# Patient Record
Sex: Male | Born: 1975
Health system: Southern US, Community
[De-identification: ages and names within clinical notes are randomized; demographics above are authoritative.]

## PROBLEM LIST (undated history)

## (undated) DIAGNOSIS — D500 Iron deficiency anemia secondary to blood loss (chronic): Secondary | ICD-10-CM

## (undated) DIAGNOSIS — K8020 Calculus of gallbladder without cholecystitis without obstruction: Secondary | ICD-10-CM

## (undated) DIAGNOSIS — E669 Obesity, unspecified: Secondary | ICD-10-CM

## (undated) DIAGNOSIS — G4730 Sleep apnea, unspecified: Secondary | ICD-10-CM

## (undated) DIAGNOSIS — Z5189 Encounter for other specified aftercare: Secondary | ICD-10-CM

## (undated) DIAGNOSIS — T7840XA Allergy, unspecified, initial encounter: Secondary | ICD-10-CM

## (undated) HISTORY — DX: Iron deficiency anemia secondary to blood loss (chronic): D50.0

## (undated) HISTORY — DX: Calculus of gallbladder without cholecystitis without obstruction: K80.20

## (undated) HISTORY — DX: Obesity, unspecified: E66.9

## (undated) HISTORY — DX: Encounter for other specified aftercare: Z51.89

## (undated) HISTORY — PX: BACK SURGERY: SHX140

## (undated) HISTORY — PX: HERNIA REPAIR: SHX51

## (undated) HISTORY — PX: CHOLECYSTECTOMY: SHX55

## (undated) HISTORY — DX: Allergy, unspecified, initial encounter: T78.40XA

## (undated) HISTORY — DX: Sleep apnea, unspecified: G47.30

## (undated) HISTORY — PX: NERVE SURGERY: SHX1016

---

## 2000-12-25 HISTORY — PX: SPLENECTOMY, TOTAL: SHX788

## 2001-12-07 ENCOUNTER — Encounter (INDEPENDENT_AMBULATORY_CARE_PROVIDER_SITE_OTHER): Payer: Self-pay | Admitting: *Deleted

## 2001-12-07 ENCOUNTER — Inpatient Hospital Stay (HOSPITAL_COMMUNITY): Admission: AC | Admit: 2001-12-07 | Discharge: 2001-12-19 | Payer: Self-pay

## 2001-12-07 ENCOUNTER — Encounter: Payer: Self-pay | Admitting: Emergency Medicine

## 2001-12-11 ENCOUNTER — Encounter: Payer: Self-pay | Admitting: Surgery

## 2001-12-13 ENCOUNTER — Encounter: Payer: Self-pay | Admitting: General Surgery

## 2001-12-15 ENCOUNTER — Encounter: Payer: Self-pay | Admitting: General Surgery

## 2001-12-17 ENCOUNTER — Encounter: Payer: Self-pay | Admitting: Surgery

## 2001-12-17 ENCOUNTER — Encounter: Payer: Self-pay | Admitting: General Surgery

## 2001-12-27 ENCOUNTER — Ambulatory Visit (HOSPITAL_COMMUNITY): Admission: RE | Admit: 2001-12-27 | Discharge: 2001-12-27 | Payer: Self-pay | Admitting: General Surgery

## 2001-12-27 ENCOUNTER — Encounter: Payer: Self-pay | Admitting: General Surgery

## 2004-03-04 ENCOUNTER — Encounter: Admission: RE | Admit: 2004-03-04 | Discharge: 2004-03-04 | Payer: Self-pay | Admitting: Orthopedic Surgery

## 2004-08-04 ENCOUNTER — Observation Stay (HOSPITAL_COMMUNITY): Admission: RE | Admit: 2004-08-04 | Discharge: 2004-08-05 | Payer: Self-pay | Admitting: Orthopedic Surgery

## 2004-08-04 ENCOUNTER — Encounter (INDEPENDENT_AMBULATORY_CARE_PROVIDER_SITE_OTHER): Payer: Self-pay | Admitting: *Deleted

## 2008-08-13 ENCOUNTER — Ambulatory Visit (HOSPITAL_COMMUNITY): Admission: RE | Admit: 2008-08-13 | Discharge: 2008-08-14 | Payer: Self-pay | Admitting: General Surgery

## 2011-02-08 ENCOUNTER — Encounter (HOSPITAL_COMMUNITY)
Admission: RE | Admit: 2011-02-08 | Discharge: 2011-02-08 | Disposition: A | Discharge status: Home / Self Care | Payer: PRIVATE HEALTH INSURANCE | Source: Ambulatory Visit | Attending: General Surgery | Admitting: General Surgery

## 2011-02-08 DIAGNOSIS — Z01812 Encounter for preprocedural laboratory examination: Secondary | ICD-10-CM | POA: Insufficient documentation

## 2011-02-08 LAB — CBC
HCT: 38 % — ABNORMAL LOW (ref 39.0–52.0)
Hemoglobin: 12.3 g/dL — ABNORMAL LOW (ref 13.0–17.0)
MCH: 28.1 pg (ref 26.0–34.0)
MCHC: 32.4 g/dL (ref 30.0–36.0)
MCV: 86.8 fL (ref 78.0–100.0)

## 2011-02-10 ENCOUNTER — Ambulatory Visit (HOSPITAL_COMMUNITY)
Admission: RE | Admit: 2011-02-10 | Discharge: 2011-02-10 | Disposition: A | Discharge status: Home / Self Care | Payer: PRIVATE HEALTH INSURANCE | Source: Ambulatory Visit | Attending: General Surgery | Admitting: General Surgery

## 2011-02-10 ENCOUNTER — Ambulatory Visit (HOSPITAL_COMMUNITY): Payer: PRIVATE HEALTH INSURANCE

## 2011-02-10 ENCOUNTER — Other Ambulatory Visit: Payer: Self-pay | Admitting: General Surgery

## 2011-02-10 DIAGNOSIS — E669 Obesity, unspecified: Secondary | ICD-10-CM | POA: Insufficient documentation

## 2011-02-10 DIAGNOSIS — K660 Peritoneal adhesions (postprocedural) (postinfection): Secondary | ICD-10-CM | POA: Insufficient documentation

## 2011-02-10 DIAGNOSIS — K8020 Calculus of gallbladder without cholecystitis without obstruction: Secondary | ICD-10-CM | POA: Insufficient documentation

## 2011-02-10 DIAGNOSIS — K219 Gastro-esophageal reflux disease without esophagitis: Secondary | ICD-10-CM | POA: Insufficient documentation

## 2011-02-10 DIAGNOSIS — Z01812 Encounter for preprocedural laboratory examination: Secondary | ICD-10-CM | POA: Insufficient documentation

## 2011-02-16 NOTE — Op Note (Signed)
NAME:  Brian Kerr, Brian Kerr                 ACCOUNT NO.:  [id number]  MEDICAL RECORD NO.:  [id number]           PATIENT TYPE:  O  LOCATION:  SDSC                         FACILITY:  [facility]  PHYSICIAN:  Adolph Pollack, M.D.DATE OF BIRTH:  [birth year]  DATE OF PROCEDURE: DATE OF DISCHARGE:  02/10/2011                              OPERATIVE REPORT   PREOPERATIVE DIAGNOSIS:  Symptomatic cholelithiasis.  POSTOPERATIVE DIAGNOSIS:  Symptomatic cholelithiasis.  PROCEDURE:  Laparoscopic lysis of adhesions (20 minutes) and cholecystectomy with intraoperative cholangiogram.  SURGEON:  Adolph Pollack, M.D.  ANESTHESIA:  General.  INDICATIONS:  Mr. Brian Kerr is a 35 year old male who has been having episodes of epigastric discomfort and nausea with right upper quadrant pain at times.  He had an ultrasound demonstrating gallstones.  The liver function tests were normal.  There was slight gallbladder wall thickening.  Because of his symptomatic cholelithiasis, he is now brought to the operating room for elective laparoscopic, possible open cholecystectomy.  He has had a previous emergency splenectomy through an upper midline incision and a laparoscopic hernia repair of a ventral incisional hernia in the past, and so he understands that it may be an open procedure.  Procedure risks and aftercare were discussed with him preoperatively.  TECHNIQUE:  He was seen in the holding area and brought to the operating room, placed supine on the operating table, and a general anesthetic was administered.  The abdominal wall hair was clipped and the abdominal wall widely sterilely prepped and draped.  A previous right midabdominal lateral incision was reincised after infiltration of Marcaine.  I dissected subcutaneous tissues.  The fascial layers were incised and the abdominal wall musculature bluntly retracted until the peritoneum was identified, and a small incision was made in the peritoneum.  The  peritoneal cavity was entered under direct vision.  Hasson trocar was introduced into the peritoneal cavity, and pneumoperitoneum was created by insufflation of CO2 gas.  Next, the laparoscope was introduced in the right upper quadrant, it was fairly free of adhesions.  There also was an area in the epigastrium for me to place a 5-mm trocar superiorly, which was done.  I then used some blunt dissection to perform lysis of adhesions between the omentum and the anterior abdominal wall, controlling bleeding with electrocautery. This allowed me to clear off an area where I could put another 5-mm trocar just below the level of the umbilicus and to the right.  Once this had been performed, I put a 5-mm trocar in the right upper quadrant.  The fundus of the gallbladder was then grasped, retracted through right shoulder.  Omental adhesions to the gallbladder were taken down with dissection on the gallbladder.  I then grasped the infundibulum, and using blunt dissection, the infundibulum was mobilized.  It was then retracted laterally.  I identified the cystic duct and the cystic artery.  Using blunt dissection, windows were created around these, and a critical view was achieved.  I then clipped the cystic artery and divided it.  A clip was placed at the cystic duct- gallbladder junction and a small incision made to the cystic duct.  A cholangiocatheter was placed through the anterior abdominal wall, placed into the cystic duct, and the cholangiogram was performed.  Using dilute contrast, I injected into the cystic duct under fluoroscopy.  The common hepatic, right and left hepatic, and common bile ducts all filled promptly.  The common bile duct also drained promptly without obvious evidence of obstruction.  Final report is pending the radiologist's interpretation.  Following this, the cholangiocatheter was removed, and the cystic duct was clipped three times on the biliary side and divided.   I then dissected the gallbladder free from the liver using electrocautery. Once this was done, it was placed in an Endopouch bag.  The gallbladder fossa was copiously irrigated with saline.  Bleeding points were controlled with electrocautery.  Further inspection demonstrated no evidence of bleeding and no evidence of bile leak.  I then removed the gallbladder and the Endopouch bag through the Hasson trocar port.  I then closed the fascial defect using Endoclose device and a zero Vicryl suture in this area.  Remaining irrigation fluid was evacuated.  The area of lysis of adhesions and the liver were inspected; there was no evidence of bleeding, bowel injury, or bile leak.  The CO2 gas was released and the remaining trocars removed.  All skin incisions were closed with 4-0 Monocryl subcuticular stitches. Steri-Strips and sterile dressings were applied.  He tolerated the procedure without any apparent complications and was taken to the recovery room in satisfactory condition.     Adolph Pollack, M.D.     Kari Baars  D:  02/10/2011  T:  02/10/2011  Job:  [id number]  cc:   Ernestina Penna, M.D.  Electronically Signed by Avel Peace M.D. on 02/16/2011 09:33:37 AM

## 2011-05-09 NOTE — Op Note (Signed)
NAME:  Brian Kerr, Brian Kerr                 ACCOUNT NO.:  [id number]   MEDICAL RECORD NO.:  [id number]          PATIENT TYPE:  OIB   LOCATION:  5155                         FACILITY:  [facility]   PHYSICIAN:  Adolph Pollack, M.D.DATE OF BIRTH:  1976/03/23   DATE OF PROCEDURE:  DATE OF DISCHARGE:                               OPERATIVE REPORT   PREOPERATIVE DIAGNOSIS:  Ventral incisional hernia.   POSTOPERATIVE DIAGNOSIS:  Ventral incisional hernia.   PROCEDURE:  Laparoscopic repair of ventral incisional hernia with mesh.   SURGEON:  Adolph Pollack, MD   ASSISTANT:  Almond Lint, MD   ANESTHESIA:  General.   INDICATIONS:  This 35 year old male underwent exploratory laparotomy and  splenectomy for ruptured spleen many years ago.  He has an enlarging and  symptomatic incisional hernia and now presents for repair.  The  procedure and risks were discussed with him preoperatively.   TECHNIQUE:  He was brought to the operating room, placed supine on the  operating table and general anesthetic was administered.  The hair on  the abdominal wall was clipped and a Foley catheter was inserted.  The  abdominal wall was sterilely prepped and draped.  On the right mid and  lateral abdomen, an incision was made through the skin, subcutaneous  tissue into fascial layers, and the peritoneal cavity was entered.  A  Hassan trocar was introduced into the peritoneal cavity.  A  pneumoperitoneum created by insufflation of CO2 gas.  The laparoscope  was introduced and I noted omental adhesions to the upper midline  incision.  A 5-mm trocar was then placed in the right lower quadrant and  a 11-mm trocar placed in the left midabdomen.  Using careful sharp and  blunt dissection, I dissected the omentum free from the abdominal wall  exposing a hernia defect in the mid epigastric region and a smaller one  just superior to this.  I did not notice any adhesion from the intestine  to abdominal wall.  I dissected  the omentum free from the abdominal  wall, well around the hernia defects.  Using a spinal needle, I then  marked the periphery of the hernia defects and measured 4 cm away from  both of these.  I brought a 15 x 20 cm piece of Parietex mesh with a  nonadherent barrier into the field which appeared to be appropriate size  for the defects.  This allowed for adequate overlap too.  Four anchoring  sutures were placed in the 4 quadrants of the mesh of #1 Novafil.  Mesh  was hydrated and then placed into abdominal cavity.  The mesh was  unrolled with the non-adherent barrier facing the viscera.  Four stab  incisions were made in four quadrants around the hernia defects and the  anchoring sutures brought up across the fascial bridge and tied down  initially anchoring the mesh to the abdominal wall.  Using the spiral  tacker, I then further anchored the mesh with an outer rim of tacks and  an inner rim of tacks to allow for good fixation of the mesh.  Following this, I inspected the area and noted some bleeding from the  omentum, which I controlled with harmonic scalpel.  Further inspection  of the abdominal cavity demomstrated no active bleeding or organ injury.  Once hemostasis was adequate, I then removed the [facility] trocar and used  a 0 Vicryl suture and Endoclose device to close  the fascia.  I then removed the remaining trocars and released  pneumoperitoneum.  The skin incisions were then closed with 4-0 Monocryl  subcuticular stitches followed by Steri-Strips and sterile dressings.  He tolerated the procedure well without any apparent complications and  was taken to the recovery room in satisfactory condition.      Adolph Pollack, M.D.  Electronically Signed     TJR/MEDQ  D:  08/13/2008  T:  08/14/2008  Job:  [id number]   cc:   [facility]
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
## 2011-05-12 NOTE — Op Note (Signed)
NAME:  Brian Kerr, Brian Kerr                           ACCOUNT NO.:  [id number]   MEDICAL RECORD NO.:  [id number]                   PATIENT TYPE:  AMB   LOCATION:  DAY                                  FACILITY:  [facility]   PHYSICIAN:  Marlowe Kays, M.D.               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   OPERATION:  Microdiskectomy, L4-5, left.   SURGEON:  Dr. Fayrene Fearing [place]   ASSISTANT:  Dr. Worthy Rancher   ANESTHESIA:  General.   PATHOLOGY AND JUSTIFICATION FOR PROCEDURE:  He has had a many months'  history of persistent left leg pain, no neurologic deficit.  MRI and  myelogram have demonstrated a large disk herniation at L4-5 to the left with  some disk material over the body of L5.  Because of the persistence of his  problems, he has also had a second opinion recommending the microdiskectomy  on the left and is here today for the above-mentioned surgery.  Findings at  surgery confirmed the MRI and myelogram.   DESCRIPTION OF PROCEDURE:  Satisfactory general anesthesia, prophylactic  antibiotics, knee-chest position on the [place] frame.  Back was prepped  with DuraPrep.  And with three spinal needles and a lateral x-ray, we  tentatively localized the L4-5 disk and then continued draping the back in a  sterile field.  I made a midline incision based on the initial x-ray.  Soft  tissue was dissected off the spinous processes of what I thought were L4 and  L5 which were tagged with Kocher clamps and second lateral x-ray taken  demonstrating that we were indeed at L4 and at L5.  With the L4-5 interspace  just somewhat caudal to the superior clamp.  I then continued dissecting the  soft tissue off the lamina of L4 and L5, and  we placed a self-retaining  McCullough retractor.  With double-action rongeur, I removed some of the  inferior lamina of L4 and then with a small curette, undermined the superior  portion of the lamina of L5 and entered beneath it with a 2 mm Kerrison  rongeur and then with a combination of 2 and 3 mm Kerrison rongeurs, removed  bone and ligamentum flavum until the preliminary opening for the diskectomy  was made.  We then brought in the microscope and completed the foraminotomy  and lateral recess decompression.  He was extremely tight.  The L5 nerve  root was identified and gently mobilized medially.  The large disk  herniation beneath it was noted.  Some overlying vein s were coagulated with  bipolar cautery.  The disk was opened, and a good chunk of the annulus  removed to allow removal of the disk which was somewhat tenacious.  It did  extend down over the body of L5.  With a combination of straight and angled  upbiting pituitaries, nerve hook, and Epstein curette, we removed all disk  material obtainable from the interspace.  Then checked with a hockey-stick.  The L3-4 foramen was patent.  The L5 nerve root was thoroughly decompressed  and mobile.  There was no disk material beneath it and no disk material in  the axilla.  The wound was also dry.  I irrigated with antibiotic solution  and placed Gelfoam over the interspace about the nerve and the dura.  Self-  retaining retractors were removed.  Once again, no unusual bleeding was  noted.  He was given 30 mg of Toradol IV.  Fascia was closed with  interrupted #1 Vicryl as was the deep subcutaneous tissues with 2-0 Vicryl  in the superficial subcutaneous tissue and staples in the skin.  Subcutaneous tissue was also infiltrated with 0.5% Marcaine with adrenalin.  Betadine and Adaptic dry sterile dressing were applied.  He was transferred  to his bed gently and taken to the recovery room in satisfactory condition  with no known  complications.                                               Marlowe Kays, M.D.    JA/MEDQ  D:  08/04/2004  T:  08/04/2004  Job:  [id number]
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
## 2014-08-27 ENCOUNTER — Ambulatory Visit: Payer: PRIVATE HEALTH INSURANCE | Admitting: Internal Medicine

## 2014-09-07 ENCOUNTER — Ambulatory Visit: Payer: PRIVATE HEALTH INSURANCE | Admitting: Internal Medicine

## 2014-09-28 ENCOUNTER — Ambulatory Visit: Payer: PRIVATE HEALTH INSURANCE | Admitting: Internal Medicine

## 2014-10-22 ENCOUNTER — Ambulatory Visit: Payer: PRIVATE HEALTH INSURANCE | Admitting: Internal Medicine

## 2016-02-11 ENCOUNTER — Telehealth: Payer: Self-pay | Admitting: Family Medicine

## 2016-02-11 NOTE — Telephone Encounter (Signed)
Patient given appointment for 8:30 in the am

## 2016-02-12 ENCOUNTER — Ambulatory Visit: Payer: Self-pay

## 2016-06-14 ENCOUNTER — Ambulatory Visit (INDEPENDENT_AMBULATORY_CARE_PROVIDER_SITE_OTHER): Payer: BLUE CROSS/BLUE SHIELD | Admitting: Physician Assistant

## 2016-06-14 ENCOUNTER — Encounter: Payer: Self-pay | Admitting: Physician Assistant

## 2016-06-14 VITALS — BP 157/100 | HR 82 | Temp 97.4°F | Ht 75.0 in | Wt 312.8 lb

## 2016-06-14 DIAGNOSIS — J0100 Acute maxillary sinusitis, unspecified: Secondary | ICD-10-CM | POA: Diagnosis not present

## 2016-06-14 MED ORDER — SULFAMETHOXAZOLE-TRIMETHOPRIM 800-160 MG PO TABS: 1.0000 | ORAL_TABLET | Freq: Two times a day (BID) | ORAL | Status: DC

## 2016-06-14 NOTE — Progress Notes (Signed)
Subjective:     Patient ID: Brian Kerr, male   DOB: Aug 26, 1976, 40 y.o.   MRN: [id number]  HPI Pt with sinus pressure and pain x 2 weeks Now with drainage from the L eye He wears contacts but took them out last pm and wearing glasses  Review of Systems  Constitutional: Negative.   HENT: Positive for congestion, postnasal drip and sinus pressure. Negative for ear discharge, ear pain, facial swelling, nosebleeds, rhinorrhea, sneezing, sore throat, tinnitus, trouble swallowing and voice change.   Eyes: Positive for discharge, redness and itching. Negative for photophobia, pain and visual disturbance.  Respiratory: Negative.   Cardiovascular: Negative.        Objective:   Physical Exam  Constitutional: He appears well-developed and well-nourished.  HENT:  Right Ear: External ear normal.  Left Ear: External ear normal.  Mouth/Throat: Oropharynx is clear and moist. No oropharyngeal exudate.  TM's nl bilat + TTP of frontal and maxillary sinus  Eyes: EOM are normal. Pupils are equal, round, and reactive to light. Right eye exhibits no discharge. Left eye exhibits discharge.  Sl lid edema to the L eye with matting to the lashes  Neck: Neck supple.  Cardiovascular: Normal rate, regular rhythm and normal heart sounds.   Pulmonary/Chest: Effort normal and breath sounds normal. No respiratory distress. He has no wheezes. He has no rales.  Lymphadenopathy:    He has no cervical adenopathy.  Nursing note and vitals reviewed.      Assessment:     1. Acute maxillary sinusitis, recurrence not specified        Plan:     Bactrim DS to cover both sinus and eye No contacts x 1 week Cool compresses Clean cases Pt to f/u regarding BP

## 2016-06-14 NOTE — Patient Instructions (Signed)
Bacterial Conjunctivitis Bacterial conjunctivitis, commonly called pink eye, is an inflammation of the clear membrane that covers the white part of the eye (conjunctiva). The inflammation can also happen on the underside of the eyelids. The blood vessels in the conjunctiva become inflamed, causing the eye to become red or pink. Bacterial conjunctivitis may spread easily from one eye to another and from person to person (contagious).  CAUSES  Bacterial conjunctivitis is caused by bacteria. The bacteria may come from your own skin, your upper respiratory tract, or from someone else with bacterial conjunctivitis. SYMPTOMS  The normally white color of the eye or the underside of the eyelid is usually pink or red. The pink eye is usually associated with irritation, tearing, and some sensitivity to light. Bacterial conjunctivitis is often associated with a thick, yellowish discharge from the eye. The discharge may turn into a crust on the eyelids overnight, which causes your eyelids to stick together. If a discharge is present, there may also be some blurred vision in the affected eye. DIAGNOSIS  Bacterial conjunctivitis is diagnosed by your caregiver through an eye exam and the symptoms that you report. Your caregiver looks for changes in the surface tissues of your eyes, which may point to the specific type of conjunctivitis. A sample of any discharge may be collected on a cotton-tip swab if you have a severe case of conjunctivitis, if your cornea is affected, or if you keep getting repeat infections that do not respond to treatment. The sample will be sent to a lab to see if the inflammation is caused by a bacterial infection and to see if the infection will respond to antibiotic medicines. TREATMENT   Bacterial conjunctivitis is treated with antibiotics. Antibiotic eyedrops are most often used. However, antibiotic ointments are also available. Antibiotics pills are sometimes used. Artificial tears or eye  washes may ease discomfort. HOME CARE INSTRUCTIONS   To ease discomfort, apply a cool, clean washcloth to your eye for 10-20 minutes, 3-4 times a day.  Gently wipe away any drainage from your eye with a warm, wet washcloth or a cotton ball.  Wash your hands often with soap and water. Use paper towels to dry your hands.  Do not share towels or washcloths. This may spread the infection.  Change or wash your pillowcase every day.  You should not use eye makeup until the infection is gone.  Do not operate machinery or drive if your vision is blurred.  Stop using contact lenses. Ask your caregiver how to sterilize or replace your contacts before using them again. This depends on the type of contact lenses that you use.  When applying medicine to the infected eye, do not touch the edge of your eyelid with the eyedrop bottle or ointment tube. SEEK IMMEDIATE MEDICAL CARE IF:   Your infection has not improved within 3 days after beginning treatment.  You had yellow discharge from your eye and it returns.  You have increased eye pain.  Your eye redness is spreading.  Your vision becomes blurred.  You have a fever or persistent symptoms for more than 2-3 days.  You have a fever and your symptoms suddenly get worse.  You have facial pain, redness, or swelling. MAKE SURE YOU:   Understand these instructions.  Will watch your condition.  Will get help right away if you are not doing well or get worse.   This information is not intended to replace advice given to you by your health care provider. Make sure you   discuss any questions you have with your health care provider.   Document Released: 12/11/2005 Document Revised: 01/01/2015 Document Reviewed: 05/13/2012 Elsevier Interactive Patient Education 2016 [employer] Sinusitis, Adult Sinusitis is redness, soreness, and inflammation of the paranasal sinuses. Paranasal sinuses are air pockets within the bones of your face. They are  located beneath your eyes, in the middle of your forehead, and above your eyes. In healthy paranasal sinuses, mucus is able to drain out, and air is able to circulate through them by way of your nose. However, when your paranasal sinuses are inflamed, mucus and air can become trapped. This can allow bacteria and other germs to grow and cause infection. Sinusitis can develop quickly and last only a short time (acute) or continue over a long period (chronic). Sinusitis that lasts for more than 12 weeks is considered chronic. CAUSES Causes of sinusitis include:  Allergies.  Structural abnormalities, such as displacement of the cartilage that separates your nostrils (deviated septum), which can decrease the air flow through your nose and sinuses and affect sinus drainage.  Functional abnormalities, such as when the small hairs (cilia) that line your sinuses and help remove mucus do not work properly or are not present. SIGNS AND SYMPTOMS Symptoms of acute and chronic sinusitis are the same. The primary symptoms are pain and pressure around the affected sinuses. Other symptoms include:  Upper toothache.  Earache.  Headache.  Bad breath.  Decreased sense of smell and taste.  A cough, which worsens when you are lying flat.  Fatigue.  Fever.  Thick drainage from your nose, which often is green and may contain pus (purulent).  Swelling and warmth over the affected sinuses. DIAGNOSIS Your health care provider will perform a physical exam. During your exam, your health care provider may perform any of the following to help determine if you have acute sinusitis or chronic sinusitis:  Look in your nose for signs of abnormal growths in your nostrils (nasal polyps).  Tap over the affected sinus to check for signs of infection.  View the inside of your sinuses using an imaging device that has a light attached (endoscope). If your health care provider suspects that you have chronic sinusitis,  one or more of the following tests may be recommended:  Allergy tests.  Nasal culture. A sample of mucus is taken from your nose, sent to a lab, and screened for bacteria.  Nasal cytology. A sample of mucus is taken from your nose and examined by your health care provider to determine if your sinusitis is related to an allergy. TREATMENT Most cases of acute sinusitis are related to a viral infection and will resolve on their own within 10 days. Sometimes, medicines are prescribed to help relieve symptoms of both acute and chronic sinusitis. These may include pain medicines, decongestants, nasal steroid sprays, or saline sprays. However, for sinusitis related to a bacterial infection, your health care provider will prescribe antibiotic medicines. These are medicines that will help kill the bacteria causing the infection. Rarely, sinusitis is caused by a fungal infection. In these cases, your health care provider will prescribe antifungal medicine. For some cases of chronic sinusitis, surgery is needed. Generally, these are cases in which sinusitis recurs more than 3 times per year, despite other treatments. HOME CARE INSTRUCTIONS  Drink plenty of water. Water helps thin the mucus so your sinuses can drain more easily.  Use a humidifier.  Inhale steam 3-4 times a day (for example, sit in the bathroom with the shower  running).  Apply a warm, moist washcloth to your face 3-4 times a day, or as directed by your health care provider.  Use saline nasal sprays to help moisten and clean your sinuses.  Take medicines only as directed by your health care provider.  If you were prescribed either an antibiotic or antifungal medicine, finish it all even if you start to feel better. SEEK IMMEDIATE MEDICAL CARE IF:  You have increasing pain or severe headaches.  You have nausea, vomiting, or drowsiness.  You have swelling around your face.  You have vision problems.  You have a stiff  neck.  You have difficulty breathing.   This information is not intended to replace advice given to you by your health care provider. Make sure you discuss any questions you have with your health care provider.   Document Released: 12/11/2005 Document Revised: 01/01/2015 Document Reviewed: 12/26/2011 Elsevier Interactive Patient Education [employer].

## 2016-06-16 ENCOUNTER — Encounter: Payer: Self-pay | Admitting: Family

## 2016-06-16 ENCOUNTER — Ambulatory Visit (INDEPENDENT_AMBULATORY_CARE_PROVIDER_SITE_OTHER): Payer: BLUE CROSS/BLUE SHIELD | Admitting: Family

## 2016-06-16 ENCOUNTER — Telehealth: Payer: Self-pay | Admitting: Physician Assistant

## 2016-06-16 ENCOUNTER — Ambulatory Visit: Payer: BLUE CROSS/BLUE SHIELD | Admitting: Family Medicine

## 2016-06-16 VITALS — BP 138/81 | HR 83 | Temp 97.1°F | Ht 75.0 in | Wt 311.0 lb

## 2016-06-16 DIAGNOSIS — J020 Streptococcal pharyngitis: Secondary | ICD-10-CM | POA: Diagnosis not present

## 2016-06-16 DIAGNOSIS — J069 Acute upper respiratory infection, unspecified: Secondary | ICD-10-CM | POA: Diagnosis not present

## 2016-06-16 MED ORDER — AMOXICILLIN-POT CLAVULANATE 875-125 MG PO TABS
1.0000 | ORAL_TABLET | Freq: Two times a day (BID) | ORAL | Status: DC
Start: 2016-06-16 — End: 2016-06-29

## 2016-06-16 NOTE — Progress Notes (Signed)
Subjective:    Patient ID: Brian Kerr, male    DOB: Dec 03, 1976, 40 y.o.   MRN: [id number]  Sinus Problem This is a recurrent problem. The current episode started in the past 7 days. The problem is unchanged. There has been no fever. His pain is at a severity of 7/10. The pain is mild. Associated symptoms include congestion, coughing, ear pain, headaches, a hoarse voice, neck pain, sinus pressure, a sore throat and swollen glands. Pertinent negatives include no chills or sneezing. Past treatments include oral decongestants. The treatment provided mild relief.  Sore Throat  Associated symptoms include congestion, coughing, ear pain, headaches, a hoarse voice, neck pain and swollen glands.      Review of Systems  Constitutional: Negative.  Negative for chills.  HENT: Positive for congestion, ear pain, hoarse voice, sinus pressure and sore throat. Negative for sneezing.   Respiratory: Positive for cough.   Cardiovascular: Negative.   Gastrointestinal: Negative.   Endocrine: Negative.   Genitourinary: Negative.   Musculoskeletal: Positive for neck pain.  Neurological: Positive for headaches.  Hematological: Negative.   Psychiatric/Behavioral: Negative.   All other systems reviewed and are negative.      Objective:   Physical Exam  Constitutional: He is oriented to person, place, and time. He appears well-developed and well-nourished. No distress.  HENT:  Head: Normocephalic.  Right Ear: External ear normal.  Left Ear: External ear normal.  Mouth/Throat: Oropharyngeal exudate present.  Nasal passage erythemas with mild swelling  Oropharynx erythemas, tonsils 2+  Eyes: Pupils are equal, round, and reactive to light. Right eye exhibits no discharge. Left eye exhibits no discharge.  Neck: Normal range of motion. Neck supple. No thyromegaly present.  Cardiovascular: Normal rate, regular rhythm, normal heart sounds and intact distal pulses.   No murmur heard. Pulmonary/Chest:  Effort normal and breath sounds normal. No respiratory distress. He has no wheezes.  Abdominal: Soft. Bowel sounds are normal. He exhibits no distension. There is no tenderness.  Musculoskeletal: Normal range of motion. He exhibits no edema or tenderness.  Neurological: He is alert and oriented to person, place, and time.  Skin: Skin is warm and dry. No rash noted. No erythema.  Psychiatric: He has a normal mood and affect. His behavior is normal. Judgment and thought content normal.  Vitals reviewed.     BP 138/81 mmHg  Pulse 83  Temp(Src) 97.1 F (36.2 C) (Oral)  Ht 6\' 3"  (1.905 m)  Wt 311 lb (141.069 kg)  BMI 38.87 kg/m2     Assessment & Plan:  1. Acute upper respiratory infection -- Take meds as prescribed - Use a cool mist humidifier  -Use saline nose sprays frequently -Saline irrigations of the nose can be very helpful if done frequently.  * 4X daily for 1 week*  * Use of a nettie pot can be helpful with this. Follow directions with this* -Force fluids -For any cough or congestion  Use plain Mucinex- regular strength or max strength is fine   * Children- consult with Pharmacist for dosing -For fever or aces or pains- take tylenol or ibuprofen appropriate for age and weight.  * for fevers greater than 101 orally you may alternate ibuprofen and tylenol every  3 hours. -Throat lozenges if help -New toothbrush in 3 days - amoxicillin-clavulanate (AUGMENTIN) 875-125 MG tablet; Take 1 tablet by mouth 2 (two) times daily.  Dispense: 20 tablet; Refill: 0  2. Streptococcal sore throat - amoxicillin-clavulanate (AUGMENTIN) 875-125 MG tablet; Take 1 tablet by  mouth 2 (two) times daily.  Dispense: 20 tablet; Refill: 0  Evelina Dun, FNP

## 2016-06-16 NOTE — Patient Instructions (Addendum)
Strep Throat Strep throat is a bacterial infection of the throat. Your health care provider may call the infection tonsillitis or pharyngitis, depending on whether there is swelling in the tonsils or at the back of the throat. Strep throat is most common during the cold months of the year in children who are 5-41 years of age, but it can happen during any season in people of any age. This infection is spread from person to person (contagious) through coughing, sneezing, or close contact. CAUSES Strep throat is caused by the bacteria called Streptococcus pyogenes. RISK FACTORS This condition is more likely to develop in:  People who spend time in crowded places where the infection can spread easily.  People who have close contact with someone who has strep throat. SYMPTOMS Symptoms of this condition include:  Fever or chills.   Redness, swelling, or pain in the tonsils or throat.  Pain or difficulty when swallowing.  White or yellow spots on the tonsils or throat.  Swollen, tender glands in the neck or under the jaw.  Red rash all over the body (rare). DIAGNOSIS This condition is diagnosed by performing a rapid strep test or by taking a swab of your throat (throat culture test). Results from a rapid strep test are usually ready in a few minutes, but throat culture test results are available after one or two days. TREATMENT This condition is treated with antibiotic medicine. HOME CARE INSTRUCTIONS Medicines  Take over-the-counter and prescription medicines only as told by your health care provider.  Take your antibiotic as told by your health care provider. Do not stop taking the antibiotic even if you start to feel better.  Have family members who also have a sore throat or fever tested for strep throat. They may need antibiotics if they have the strep infection. Eating and Drinking  Do not share food, drinking cups, or personal items that could cause the infection to spread to  other people.  If swallowing is difficult, try eating soft foods until your sore throat feels better.  Drink enough fluid to keep your urine clear or pale yellow. General Instructions  Gargle with a salt-water mixture 3-4 times per day or as needed. To make a salt-water mixture, completely dissolve -1 tsp of salt in 1 cup of warm water.  Make sure that all household members wash their hands well.  Get plenty of rest.  Stay home from school or work until you have been taking antibiotics for 24 hours.  Keep all follow-up visits as told by your health care provider. This is important. SEEK MEDICAL CARE IF:  The glands in your neck continue to get bigger.  You develop a rash, cough, or earache.  You cough up a thick liquid that is green, yellow-brown, or bloody.  You have pain or discomfort that does not get better with medicine.  Your problems seem to be getting worse rather than better.  You have a fever. SEEK IMMEDIATE MEDICAL CARE IF:  You have new symptoms, such as vomiting, severe headache, stiff or painful neck, chest pain, or shortness of breath.  You have severe throat pain, drooling, or changes in your voice.  You have swelling of the neck, or the skin on the neck becomes red and tender.  You have signs of dehydration, such as fatigue, dry mouth, and decreased urination.  You become increasingly sleepy, or you cannot wake up completely.  Your joints become red or painful.   This information is not intended to replace   advice given to you by your health care provider. Make sure you discuss any questions you have with your health care provider.   Document Released: 12/08/2000 Document Revised: 09/01/2015 Document Reviewed: 04/05/2015 Elsevier Interactive Patient Education 2016 [place] meds as prescribed - Use a cool mist humidifier  -Use saline nose sprays frequently -Saline irrigations of the nose can be very helpful if done frequently.  * 4X  daily for 1 week*  * Use of a nettie pot can be helpful with this. Follow directions with this* -Force fluids -For any cough or congestion  Use plain Mucinex- regular strength or max strength is fine   * Children- consult with Pharmacist for dosing -For fever or aces or pains- take tylenol or ibuprofen appropriate for age and weight.  * for fevers greater than 101 orally you may alternate ibuprofen and tylenol every  3 hours. -Throat lozenges if help -New toothbrush in 3 days   Evelina Dun, FNP

## 2016-06-16 NOTE — Telephone Encounter (Signed)
Advised pt we would need to pull his paper chart and then would let him know. Pt voiced understanding.

## 2016-06-29 ENCOUNTER — Encounter: Payer: Self-pay | Admitting: Family Medicine

## 2016-06-29 ENCOUNTER — Ambulatory Visit (INDEPENDENT_AMBULATORY_CARE_PROVIDER_SITE_OTHER): Payer: BLUE CROSS/BLUE SHIELD | Admitting: Family Medicine

## 2016-06-29 VITALS — BP 145/95 | HR 76 | Temp 97.2°F | Ht 75.0 in | Wt 308.4 lb

## 2016-06-29 DIAGNOSIS — R030 Elevated blood-pressure reading, without diagnosis of hypertension: Secondary | ICD-10-CM

## 2016-06-29 DIAGNOSIS — Z0000 Encounter for general adult medical examination without abnormal findings: Secondary | ICD-10-CM | POA: Diagnosis not present

## 2016-06-29 DIAGNOSIS — Z800 Family history of malignant neoplasm of digestive organs: Secondary | ICD-10-CM

## 2016-06-29 DIAGNOSIS — R0683 Snoring: Secondary | ICD-10-CM

## 2016-06-29 NOTE — Patient Instructions (Signed)
Great to see you!  Check your blood pressure 2-3 times a week over th enext 2 months, follow up if you have readings over 140/90  We will call with lab results within 1 week  You will hear from GI about a colonoscopy

## 2016-06-29 NOTE — Progress Notes (Signed)
   HPI  Patient presents today here for an annual physical exam.  Patient has no complaints. He requests colonoscopy, his mother died from colon cancer at the age of 103, it was diagnosed at age of 29. He denies any rectal bleeding or other concerns.  He has a history of traumatic splenic rupture with spleen removal. He then had a hernia which required mesh implantation. He's also had gallbladder removal. He has mild steatorrhea after  greasy meals.  Snoring Patient also states that his wife is concerned he has sleep apnea He has loud snoring at night, periods of apnea He has mild daytime sleepiness.  He is watching his diet minimally, he is also planning to become physically active but overall has not started any formal regimen.  PMH: Smoking status noted ROS: Per HPI  Objective: BP 145/95 mmHg  Pulse 76  Temp(Src) 97.2 F (36.2 C) (Oral)  Ht '6\' 3"'$  (1.905 m)  Wt 308 lb 6.4 oz (139.889 kg)  BMI 38.55 kg/m2 Gen: NAD, alert, cooperative with exam HEENT: NCAT, EOMI, nares clear, oropharynx clear, TMs normal bilaterally Neck circumference 17 inches CV: RRR, good S1/S2, no murmur Resp: CTABL, no wheezes, non-labored Abd: SNTND, BS present, no guarding or organomegaly, well-healed scar midline and right upper quadrant Ext: No edema, warm Neuro: Alert and oriented, No gross deficits  Stop-bang  score is 5  Assessment and plan:  # Elevated blood pressure without diagnosis of hypertension Borderline Discussed diet and her size as therapeutic lifestyle changes Given log and follow-up in 2-3 months  Vitals - 1 value per visit 06/29/2016 06/16/2016 3/41/9622  SYSTOLIC [phone]  DIASTOLIC [phone]    # Snoring Referring to pulmonology for OSA eval Stop Bang score 5  # Annual physical exam Labs Discussed diet and exercise to reduce weight  # Family history of colon cancer His mother died from colon cancer 2 months after diagnosis when she was 30 I referred him to GI  for discussion of colonoscopy    Orders Placed This Encounter  Procedures  . CMP14+EGFR  . CBC with Differential  . TSH  . Lipid Panel  . Ambulatory referral to Gastroenterology    Referral Priority:  Routine    Referral Type:  Consultation    Referral Reason:  Specialty Services Required    Number of Visits Requested:  [place], MD [facility] 06/29/2016, 10:10 AM

## 2016-06-30 LAB — CMP14+EGFR
ALK PHOS: 77 IU/L (ref 39–117)
ALT: 67 IU/L — AB (ref 0–44)
AST: 42 IU/L — AB (ref 0–40)
Albumin/Globulin Ratio: 1.4 (ref 1.2–2.2)
Albumin: 4.3 g/dL (ref 3.5–5.5)
BUN/Creatinine Ratio: 9 (ref 9–20)
BUN: 8 mg/dL (ref 6–24)
Bilirubin Total: 0.9 mg/dL (ref 0.0–1.2)
CALCIUM: 9.4 mg/dL (ref 8.7–10.2)
CO2: 23 mmol/L (ref 18–29)
CREATININE: 0.93 mg/dL (ref 0.76–1.27)
Chloride: 98 mmol/L (ref 96–106)
GFR calc Af Amer: 117 mL/min/{1.73_m2} (ref >59)
GFR, EST NON AFRICAN AMERICAN: 102 mL/min/{1.73_m2} (ref >59)
GLUCOSE: 94 mg/dL (ref 65–99)
Globulin, Total: 3 g/dL (ref 1.5–4.5)
Potassium: 4.1 mmol/L (ref 3.5–5.2)
Sodium: 140 mmol/L (ref 134–144)
Total Protein: 7.3 g/dL (ref 6.0–8.5)

## 2016-06-30 LAB — LIPID PANEL
CHOLESTEROL TOTAL: 246 mg/dL — AB (ref 100–199)
Chol/HDL Ratio: 7.7 ratio units — ABNORMAL HIGH (ref 0.0–5.0)
HDL: 32 mg/dL — AB (ref >39)
LDL CALC: 164 mg/dL — AB (ref 0–99)
TRIGLYCERIDES: 248 mg/dL — AB (ref 0–149)
VLDL Cholesterol Cal: 50 mg/dL — ABNORMAL HIGH (ref 5–40)

## 2016-06-30 LAB — CBC WITH DIFFERENTIAL/PLATELET
Basophils Absolute: 0.1 10*3/uL (ref 0.0–0.2)
Basos: 0 %
EOS (ABSOLUTE): 0.6 10*3/uL — ABNORMAL HIGH (ref 0.0–0.4)
EOS: 4 %
HEMATOCRIT: 50.1 % (ref 37.5–51.0)
HEMOGLOBIN: 17.3 g/dL (ref 12.6–17.7)
IMMATURE GRANS (ABS): 0 10*3/uL (ref 0.0–0.1)
IMMATURE GRANULOCYTES: 0 %
LYMPHS: 23 %
Lymphocytes Absolute: 3.1 10*3/uL (ref 0.7–3.1)
MCH: 31.6 pg (ref 26.6–33.0)
MCHC: 34.5 g/dL (ref 31.5–35.7)
MCV: 91 fL (ref 79–97)
MONOCYTES: 7 %
MONOS ABS: 0.9 10*3/uL (ref 0.1–0.9)
NEUTROS PCT: 66 %
Neutrophils Absolute: 8.6 10*3/uL — ABNORMAL HIGH (ref 1.4–7.0)
Platelets: 423 10*3/uL — ABNORMAL HIGH (ref 150–379)
RBC: 5.48 x10E6/uL (ref 4.14–5.80)
RDW: 14.1 % (ref 12.3–15.4)
WBC: 13.3 10*3/uL — AB (ref 3.4–10.8)

## 2016-06-30 LAB — TSH: TSH: 2.5 u[IU]/mL (ref 0.450–4.500)

## 2016-07-18 ENCOUNTER — Telehealth: Payer: Self-pay | Admitting: Physician Assistant

## 2016-07-19 NOTE — Telephone Encounter (Signed)
Patient aware of results.

## 2016-07-31 ENCOUNTER — Encounter: Payer: Self-pay | Admitting: Internal Medicine

## 2016-08-03 ENCOUNTER — Ambulatory Visit: Payer: BLUE CROSS/BLUE SHIELD | Admitting: Family Medicine

## 2016-09-27 ENCOUNTER — Encounter (INDEPENDENT_AMBULATORY_CARE_PROVIDER_SITE_OTHER): Payer: Self-pay

## 2016-09-27 ENCOUNTER — Encounter: Payer: Self-pay | Admitting: Pulmonary Disease

## 2016-09-27 ENCOUNTER — Ambulatory Visit (INDEPENDENT_AMBULATORY_CARE_PROVIDER_SITE_OTHER): Payer: BLUE CROSS/BLUE SHIELD | Admitting: Pulmonary Disease

## 2016-09-27 VITALS — BP 126/88 | HR 79 | Ht 72.0 in | Wt 312.6 lb

## 2016-09-27 DIAGNOSIS — E6609 Other obesity due to excess calories: Secondary | ICD-10-CM | POA: Diagnosis not present

## 2016-09-27 DIAGNOSIS — Z6839 Body mass index (BMI) 39.0-39.9, adult: Secondary | ICD-10-CM

## 2016-09-27 DIAGNOSIS — G4710 Hypersomnia, unspecified: Secondary | ICD-10-CM | POA: Insufficient documentation

## 2016-09-27 DIAGNOSIS — E669 Obesity, unspecified: Secondary | ICD-10-CM | POA: Insufficient documentation

## 2016-09-27 NOTE — Patient Instructions (Signed)

## 2016-09-27 NOTE — Assessment & Plan Note (Signed)
Weight reduction 

## 2016-09-27 NOTE — Assessment & Plan Note (Signed)
Patient has snoring, witnessed apneas, occasional gasping or choking, occasional frequent awakenings. Sleeps for 6-7 hrs/night. Has unrefreshed sleep in am with headaches. Has hypersomnia. Hypersomnia affects fxnality. Takes naps in pm if he has the time. (-) abnormal behavior in sleep.   ESS 12.   Plan :  We discussed about the diagnosis of Obstructive Sleep Apnea (OSA) and implications of untreated OSA. We discussed about CPAP and BiPaP as possible treatment options.    We will schedule the patient for a sleep study. Plan for a HST. Anticipate no issues with cpap. He knows people with OSA on CPAP. May need BiPaP.    Patient was instructed to call the office if he/she has not heard back from the office 1-2 weeks after the sleep study.   Patient was instructed to call the office if he/she is having issues with the PAP device.   We discussed good sleep hygiene.   Patient was advised not to engage in activities requiring concentration and/or vigilance if he/she is sleepy.  Patient was advised not to drive if he/she is sleepy.

## 2016-09-27 NOTE — Progress Notes (Signed)
Subjective:    Patient ID: Brian Kerr, male    DOB: 04-May-1976, 40 y.o.   MRN: [id number]  HPI   This is the case of Brian Kerr, 40 y.o. Male, who was referred by Dr. Kenn File  in consultation regarding possible OSA.   As you very well know, patient has a 5 PY smoking history (quit in 1996), not known to have asthma or copd. Patient has snoring, witnessed apneas, occasional gasping or choking, occasional frequent awakenings. Sleeps for 6-7 hrs/night. Has unrefreshed sleep in am with headaches. Has hypersomnia. Hypersomnia affects fxnality. Takes naps in pm if he has the time.   ESS 12.    Review of Systems  Constitutional: Negative.  Negative for fever and unexpected weight change.  HENT: Positive for congestion and sinus pressure. Negative for dental problem, ear pain, nosebleeds, postnasal drip, rhinorrhea, sneezing, sore throat and trouble swallowing.   Eyes: Negative.  Negative for redness and itching.  Respiratory: Negative.  Negative for cough, chest tightness, shortness of breath and wheezing.   Cardiovascular: Positive for leg swelling. Negative for palpitations.  Gastrointestinal: Negative.  Negative for nausea and vomiting.  Endocrine: Negative.   Genitourinary: Negative.  Negative for dysuria.  Musculoskeletal: Negative.  Negative for joint swelling.  Skin: Negative.  Negative for rash.  Allergic/Immunologic: Positive for environmental allergies.  Neurological: Positive for headaches.  Hematological: Negative.  Does not bruise/bleed easily.  Psychiatric/Behavioral: Negative.  Negative for dysphoric mood. The patient is not nervous/anxious.    No past medical history on file.  (-) CA, DVT, HTN, DM, CVA  Family History  Problem Relation Age of Onset  . Cancer Mother     liver  . Diabetes Mother   . Cancer Paternal Grandmother     lymph nodes  . Cancer Paternal Grandfather     prostate     Past Surgical History:  Procedure Laterality Date  . BACK  SURGERY     l4-l5  . CHOLECYSTECTOMY    . HERNIA REPAIR     umbilical  . NERVE SURGERY Left    knee  . SPLENECTOMY, TOTAL  2002    Social History   Social History  . Marital status: Married    Spouse name: N/A  . Number of children: N/A  . Years of education: N/A   Occupational History  . Not on file.   Social History Main Topics  . Smoking status: Former Smoker    Quit date: 06/14/1998  . Smokeless tobacco: Not on file  . Alcohol use Yes     Comment: occasional  . Drug use: No  . Sexual activity: Not on file   Other Topics Concern  . Not on file   Social History Narrative  . No narrative on file     Allergies  Allergen Reactions  . Morphine And Related Other (See Comments)    irritable     No outpatient prescriptions prior to visit.   No facility-administered medications prior to visit.    No orders of the defined types were placed in this encounter.        Objective:   Physical Exam  Vitals:  Vitals:   09/27/16 0944  BP: 126/88  Pulse: 79  SpO2: 94%  Weight: (!) 312 lb 9.6 oz (141.8 kg)  Height: 6\' 3"  (1.905 m)    Constitutional/General:  Pleasant, well-nourished, well-developed, not in any distress,  Comfortably seating.  Well kempt  Body mass index is 39.07 kg/m. Wt Readings  from Last 3 Encounters:  09/27/16 (!) 312 lb 9.6 oz (141.8 kg)  06/29/16 (!) 308 lb 6.4 oz (139.9 kg)  06/16/16 (!) 311 lb (141.1 kg)    Neck circumference: 18 inches  HEENT: Pupils equal and reactive to light and accommodation. Anicteric sclerae. Normal nasal mucosa.   No oral  lesions,  mouth clear,  oropharynx clear, no postnasal drip. (-) Oral thrush. No dental caries.  Airway - Mallampati class III-IV  Neck: No masses. Midline trachea. No JVD, (-) LAD. (-) bruits appreciated.  Respiratory/Chest: Grossly normal chest. (-) deformity. (-) Accessory muscle use.  Symmetric expansion. (-) Tenderness on palpation.  Resonant on percussion.  Diminished BS on  both lower lung zones. (-) wheezing, crackles, rhonchi (-) egophony  Cardiovascular: Regular rate and  rhythm, heart sounds normal, no murmur or gallops, no peripheral edema  Gastrointestinal:  Normal bowel sounds. Soft, non-tender. No hepatosplenomegaly.  (-) masses.   Musculoskeletal:  Normal muscle tone. Normal gait.   Extremities: Grossly normal. (-) clubbing, cyanosis.  (-) edema  Skin: (-) rash,lesions seen.   Neurological/Psychiatric : alert, oriented to time, place, person. Normal mood and affect          Assessment & Plan:  Hypersomnia Patient has snoring, witnessed apneas, occasional gasping or choking, occasional frequent awakenings. Sleeps for 6-7 hrs/night. Has unrefreshed sleep in am with headaches. Has hypersomnia. Hypersomnia affects fxnality. Takes naps in pm if he has the time. (-) abnormal behavior in sleep.   ESS 12.   Plan :  We discussed about the diagnosis of Obstructive Sleep Apnea (OSA) and implications of untreated OSA. We discussed about CPAP and BiPaP as possible treatment options.    We will schedule the patient for a sleep study. Plan for a HST. Anticipate no issues with cpap. He knows people with OSA on CPAP. May need BiPaP.    Patient was instructed to call the office if he/she has not heard back from the office 1-2 weeks after the sleep study.   Patient was instructed to call the office if he/she is having issues with the PAP device.   We discussed good sleep hygiene.   Patient was advised not to engage in activities requiring concentration and/or vigilance if he/she is sleepy.  Patient was advised not to drive if he/she is sleepy.    Obesity Weight reduction     Thank you very much for letting me participate in this patient's care. Please do not hesitate to give me a call if you have any questions or concerns regarding the treatment plan.   Patient will follow up with me in 6-8 weeks.     Monica Becton, MD 09/27/2016    10:10 AM [facility] Pager: [phone] Office: [phone], Fax: [phone]

## 2016-10-04 ENCOUNTER — Ambulatory Visit (INDEPENDENT_AMBULATORY_CARE_PROVIDER_SITE_OTHER): Payer: BLUE CROSS/BLUE SHIELD | Admitting: Internal Medicine

## 2016-10-04 ENCOUNTER — Encounter: Payer: Self-pay | Admitting: Internal Medicine

## 2016-10-04 VITALS — BP 130/84 | HR 86 | Ht 72.0 in | Wt 312.4 lb

## 2016-10-04 DIAGNOSIS — K219 Gastro-esophageal reflux disease without esophagitis: Secondary | ICD-10-CM | POA: Diagnosis not present

## 2016-10-04 DIAGNOSIS — R7989 Other specified abnormal findings of blood chemistry: Secondary | ICD-10-CM | POA: Diagnosis not present

## 2016-10-04 DIAGNOSIS — Z800 Family history of malignant neoplasm of digestive organs: Secondary | ICD-10-CM | POA: Diagnosis not present

## 2016-10-04 DIAGNOSIS — R945 Abnormal results of liver function studies: Secondary | ICD-10-CM

## 2016-10-04 MED ORDER — NA SULFATE-K SULFATE-MG SULF 17.5-3.13-1.6 GM/177ML PO SOLN: 1.0000 | Freq: Once | ORAL | 0 refills | Status: AC
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
## 2016-10-04 NOTE — Patient Instructions (Signed)

## 2016-10-16 DIAGNOSIS — G4733 Obstructive sleep apnea (adult) (pediatric): Secondary | ICD-10-CM | POA: Diagnosis not present

## 2016-10-19 ENCOUNTER — Telehealth: Payer: Self-pay | Admitting: Pulmonary Disease

## 2016-10-19 DIAGNOSIS — G4733 Obstructive sleep apnea (adult) (pediatric): Secondary | ICD-10-CM | POA: Diagnosis not present

## 2016-10-19 NOTE — Telephone Encounter (Signed)
   HOME SLEEP STUDY  showed OSA. I called pt and mentioned to him results.   Pt stops breathing  93  times an hour.   Home sleep study was done on : 10/16/16 Pls schedule pt for a  CPAP/BiPaP titration lab study to determine the best pressure. He knows about this.   Let me know if you receive this.   Thanks!   J. Shirl Harris, MD 10/19/2016, 3:07 PM

## 2016-10-20 ENCOUNTER — Other Ambulatory Visit: Payer: Self-pay | Admitting: *Deleted

## 2016-10-20 DIAGNOSIS — G4710 Hypersomnia, unspecified: Secondary | ICD-10-CM

## 2016-10-20 NOTE — Telephone Encounter (Signed)
LM for pt x 1  

## 2016-10-31 ENCOUNTER — Encounter: Payer: Self-pay | Admitting: Internal Medicine

## 2016-11-03 ENCOUNTER — Other Ambulatory Visit: Payer: Self-pay

## 2016-11-03 DIAGNOSIS — G4710 Hypersomnia, unspecified: Secondary | ICD-10-CM

## 2016-11-03 NOTE — Telephone Encounter (Signed)
Spoke with pt. And informed him about Dr. Corrie Dandy recc. Pt. Has agreed to having the CPAP/BiPAP titration. The order has been placed. Nothing further is needed

## 2016-11-07 ENCOUNTER — Ambulatory Visit (HOSPITAL_BASED_OUTPATIENT_CLINIC_OR_DEPARTMENT_OTHER): Payer: BLUE CROSS/BLUE SHIELD | Attending: Pulmonary Disease | Admitting: Pulmonary Disease

## 2016-11-07 DIAGNOSIS — G4730 Sleep apnea, unspecified: Secondary | ICD-10-CM | POA: Diagnosis present

## 2016-11-07 DIAGNOSIS — G4733 Obstructive sleep apnea (adult) (pediatric): Secondary | ICD-10-CM | POA: Insufficient documentation

## 2016-11-07 DIAGNOSIS — G4710 Hypersomnia, unspecified: Secondary | ICD-10-CM | POA: Diagnosis not present

## 2016-11-08 ENCOUNTER — Telehealth: Payer: Self-pay | Admitting: Pulmonary Disease

## 2016-11-08 NOTE — Procedures (Signed)
    NAME: Brian Kerr DATE OF BIRTH:  12/20/1976 MEDICAL RECORD NUMBER [id number]  LOCATION: [facility]  PHYSICIAN: [place]  DATE OF STUDY: 11/07/2016  CLINICAL INFORMATION  The patient is referred for a CPAP titration to treat sleep apnea. Date of NPSG, Split Night or HST:   SLEEP STUDY TECHNIQUE  As per the AASM Manual for the Scoring of Sleep and Associated Events v2.3 (April 2016) with a hypopnea requiring 4% desaturations.  The channels recorded and monitored were frontal, central and occipital EEG, electrooculogram (EOG), submentalis EMG (chin), nasal and oral airflow, thoracic and abdominal wall motion, anterior tibialis EMG, snore microphone, electrocardiogram, and pulse oximetry. Continuous positive airway pressure (CPAP) was initiated at the beginning of the study and titrated to treat sleep-disordered breathing.  MEDICATIONS  Medications self-administered by patient taken the night of the study : N/A. Medications reviewed per chart review.  TECHNICIAN COMMENTS  Comments added by technician: NO RESTROOM VISTED. Patient had difficulty initiating sleep.  Comments added by scorer: N/A   RESPIRATORY PARAMETERS  Optimal PAP Pressure (cm): 16 AHI at Optimal Pressure (/hr): 0.3  Overall Minimal O2 (%): 82.00 Supine % at Optimal Pressure (%): 100  Minimal O2 at Optimal Pressure (%): 90.0    SLEEP ARCHITECTURE  The study was initiated at 10:24:07 PM and ended at 4:26:10 AM.  Sleep onset time was 53.7 minutes and the sleep efficiency was 84.1%. The total sleep time was 304.5 minutes.  The patient spent 2.13% of the night in stage N1 sleep, 32.35% in stage N2 sleep, 33.66% in stage N3 and 31.86% in REM.Stage REM latency was 72.5 minutes  Wake after sleep onset was 3.8. Alpha intrusion was absent. Supine sleep was 100.00%.  CARDIAC DATA  The 2 lead EKG demonstrated sinus rhythm. The mean heart rate was 76.37 beats per minute. Other EKG findings  include: None.   LEG MOVEMENT DATA  The total Periodic Limb Movements of Sleep (PLMS) were 0. The PLMS index was 0.00. A PLMS index of <15 is considered normal in adults.  IMPRESSIONS  The optimal PAP pressure was 16 cm of water. Central sleep apnea was not noted during this titration (CAI = 0.2/h). Moderate oxygen desaturations were observed during this titration (min O2 = 82.00%). No snoring was audible during this study. No cardiac abnormalities were observed during this study. Clinically significant periodic limb movements were not noted during this study. Arousals associated with PLMs were rare.   DIAGNOSIS  Obstructive Sleep Apnea (327.23 [G47.33 ICD-10])   RECOMMENDATIONS  1. Trial of autoCPAP therapy on 14-16 cm H2O with a Medium size Fisher&Paykel Full Face Mask Simplus mask and heated humidification. 2. Avoid alcohol, sedatives and other CNS depressants that may worsen sleep apnea and disrupt normal sleep architecture. 3. Sleep hygiene should be reviewed to assess factors that may improve sleep quality. 4. Weight management and regular exercise should be initiated or continued. 5. Return to the office 4-6 weeks after obtaining cpap machine.   Monica Becton, MD 11/08/2016, 12:26 PM [facility] Pager [phone] After 3 pm or if no answer, call [phone]

## 2016-11-08 NOTE — Telephone Encounter (Signed)
  Please call the pt and tell the pt the INLAB sleep study showed he did well on cpap 16 cm water.    Please order autoCPAP 14-16 cm H2O with a Medium size Fisher&Paykel Full Face Mask Simplus mask and heated humidification Patient will need a 1 month download.   Patient needs to be seen by me or any of the NPs/APPs  4-6 weeks after obtaining the cpap machine. Let me know if you receive this.   Thanks!   J. Shirl Harris, MD 11/08/2016, 12:28 PM

## 2016-11-10 ENCOUNTER — Other Ambulatory Visit: Payer: Self-pay

## 2016-11-10 ENCOUNTER — Ambulatory Visit (AMBULATORY_SURGERY_CENTER): Payer: BLUE CROSS/BLUE SHIELD | Admitting: Internal Medicine

## 2016-11-10 ENCOUNTER — Encounter: Payer: Self-pay | Admitting: Internal Medicine

## 2016-11-10 VITALS — BP 129/88 | HR 85 | Temp 99.8°F | Resp 16 | Ht 72.0 in | Wt 312.0 lb

## 2016-11-10 DIAGNOSIS — G4733 Obstructive sleep apnea (adult) (pediatric): Secondary | ICD-10-CM

## 2016-11-10 DIAGNOSIS — Z800 Family history of malignant neoplasm of digestive organs: Secondary | ICD-10-CM | POA: Diagnosis not present

## 2016-11-10 DIAGNOSIS — Z1212 Encounter for screening for malignant neoplasm of rectum: Secondary | ICD-10-CM | POA: Diagnosis not present

## 2016-11-10 DIAGNOSIS — Z1211 Encounter for screening for malignant neoplasm of colon: Secondary | ICD-10-CM | POA: Diagnosis not present

## 2016-11-10 MED ORDER — SODIUM CHLORIDE 0.9 % IV SOLN: 500.0000 mL | INTRAVENOUS | Status: DC

## 2016-11-10 NOTE — Patient Instructions (Addendum)
YOU HAD AN ENDOSCOPIC PROCEDURE TODAY AT THE [facility]:   Refer to the procedure report that was given to you for any specific questions about what was found during the examination.  If the procedure report does not answer your questions, please call your gastroenterologist to clarify.  If you requested that your care partner not be given the details of your procedure findings, then the procedure report has been included in a sealed envelope for you to review at your convenience later.  YOU SHOULD EXPECT: Some feelings of bloating in the abdomen. Passage of more gas than usual.  Walking can help get rid of the air that was put into your GI tract during the procedure and reduce the bloating. If you had a lower endoscopy (such as a colonoscopy or flexible sigmoidoscopy) you may notice spotting of blood in your stool or on the toilet paper. If you underwent a bowel prep for your procedure, you may not have a normal bowel movement for a few days.  Please Note:  You might notice some irritation and congestion in your nose or some drainage.  This is from the oxygen used during your procedure.  There is no need for concern and it should clear up in a day or so.  SYMPTOMS TO REPORT IMMEDIATELY:   Following lower endoscopy (colonoscopy or flexible sigmoidoscopy):  Excessive amounts of blood in the stool  Significant tenderness or worsening of abdominal pains  Swelling of the abdomen that is new, acute  Fever of 100F or higher   For urgent or emergent issues, a gastroenterologist can be reached at any hour by calling [phone]. Please read all handouts given to you by your recovery nurse.   DIET:  We do recommend a small meal at first, but then you may proceed to your regular diet.  Drink plenty of fluids but you should avoid alcoholic beverages for 24 hours.  ACTIVITY:  You should plan to take it easy for the rest of today and you should NOT DRIVE or use heavy machinery until  tomorrow (because of the sedation medicines used during the test).    FOLLOW UP: Our staff will call the number listed on your records the next business day following your procedure to check on you and address any questions or concerns that you may have regarding the information given to you following your procedure. If we do not reach you, we will leave a message.  However, if you are feeling well and you are not experiencing any problems, there is no need to return our call.  We will assume that you have returned to your regular daily activities without incident.  If any biopsies were taken you will be contacted by phone or by letter within the next 1-3 weeks.  Please call us at [phone] if you have not heard about the biopsies in 3 weeks.    SIGNATURES/CONFIDENTIALITY: You and/or your care partner have signed paperwork which will be entered into your electronic medical record.  These signatures attest to the fact that that the information above on your After Visit Summary has been reviewed and is understood.  Full responsibility of the confidentiality of this discharge information lies with you and/or your care-partner.  Thank you for letting us take care of your healthcare needs today. 

## 2016-11-10 NOTE — Op Note (Signed)
[facility] Patient Name: Brian Kerr Procedure Date: 11/10/2016 2:12 PM MRN: [id number] Endoscopist: Docia Chuck. Henrene Pastor , MD Age: 40 Referring MD:  Date of Birth: [birth year] Gender: Male Account #: [id number] Procedure:                Colonoscopy Indications:              Screening in patient at increased risk: Colorectal                            cancer in mother before age 31 Medicines:                Monitored Anesthesia Care Procedure:                Pre-Anesthesia Assessment:                           - Prior to the procedure, a History and Physical                            was performed, and patient medications and                            allergies were reviewed. The patient's tolerance of                            previous anesthesia was also reviewed. The risks                            and benefits of the procedure and the sedation                            options and risks were discussed with the patient.                            All questions were answered, and informed consent                            was obtained. Prior Anticoagulants: The patient has                            taken no previous anticoagulant or antiplatelet                            agents. ASA Grade Assessment: II - A patient with                            mild systemic disease. After reviewing the risks                            and benefits, the patient was deemed in                            satisfactory condition to undergo the procedure.  After obtaining informed consent, the colonoscope                            was passed under direct vision. Throughout the                            procedure, the patient's blood pressure, pulse, and                            oxygen saturations were monitored continuously. The                            Model CF-HQ190L [id number]) scope was introduced                            through the anus and advanced to  the the cecum,                            identified by appendiceal orifice and ileocecal                            valve. The ileocecal valve, appendiceal orifice,                            and rectum were photographed. The quality of the                            bowel preparation was excellent. The colonoscopy                            was performed without difficulty. The patient                            tolerated the procedure well. The bowel preparation                            used was SUPREP. Scope In: 2:40:18 PM Scope Out: 2:53:12 PM Scope Withdrawal Time: 0 hours 11 minutes 17 seconds  Total Procedure Duration: 0 hours 12 minutes 54 seconds  Findings:                 Multiple small and large-mouthed diverticula were                            found in the entire colon.                           Internal hemorrhoids were found during                            retroflexion. The hemorrhoids were small.                           The exam was otherwise without abnormality on  direct and retroflexion views. Complications:            No immediate complications. Estimated blood loss:                            None. Estimated Blood Loss:     Estimated blood loss: none. Impression:               - Diverticulosis in the entire examined colon.                           - Internal hemorrhoids.                           - The examination was otherwise normal on direct                            and retroflexion views.                           - No specimens collected. Recommendation:           - Repeat colonoscopy in 5 years for screening                            purposes.                           - Patient has a contact number available for                            emergencies. The signs and symptoms of potential                            delayed complications were discussed with the                            patient. Return to normal activities  tomorrow.                            Written discharge instructions were provided to the                            patient.                           - Resume previous diet.                           - Continue present medications. Docia Chuck. Henrene Pastor, MD 11/10/2016 2:57:52 PM This report has been signed electronically.

## 2016-11-10 NOTE — Progress Notes (Signed)
A and O x3. Report to RN. Tolerated MAC anesthesia well. 

## 2016-11-10 NOTE — Telephone Encounter (Signed)
Spoke with pt. And he agreed to the order for the cpap. We also were able to get him a follow up appointment with TP in 4 weeks. He will give [place] a call if he does not receive his CPAP machine. The order has been placed. Nothing further is needed at this time.

## 2016-11-13 ENCOUNTER — Telehealth: Payer: Self-pay | Admitting: *Deleted

## 2016-11-13 NOTE — Telephone Encounter (Signed)
  Follow up Call-  Call back number 11/10/2016  Post procedure Call Back phone  # [phone]  Permission to leave phone message Yes  Some recent data might be hidden     Patient questions:  Do you have a fever, pain , or abdominal swelling? No. Pain Score  0 *  Have you tolerated food without any problems? Yes.    Have you been able to return to your normal activities? Yes.    Do you have any questions about your discharge instructions: Diet   No. Medications  No. Follow up visit  No.  Do you have questions or concerns about your Care? No.  Actions: * If pain score is 4 or above: No action needed, pain <4.

## 2016-12-05 ENCOUNTER — Ambulatory Visit: Payer: BLUE CROSS/BLUE SHIELD | Admitting: Adult Health

## 2016-12-06 ENCOUNTER — Ambulatory Visit: Payer: BLUE CROSS/BLUE SHIELD | Admitting: Adult Health

## 2017-01-31 ENCOUNTER — Telehealth: Payer: Self-pay

## 2017-01-31 NOTE — Telephone Encounter (Signed)
Contacted the pt. Because [facility] stated pt. Has not picked up his cpap machine. Contacted the pt. He stated he has forgot to pick it up. Phone number to [facility] was given to the pt. To contact them about picking it up. Will set a reminder for a download.

## 2017-07-06 ENCOUNTER — Telehealth: Payer: Self-pay | Admitting: Pulmonary Disease

## 2017-07-06 DIAGNOSIS — G4733 Obstructive sleep apnea (adult) (pediatric): Secondary | ICD-10-CM

## 2017-07-06 NOTE — Telephone Encounter (Signed)
Okay to send order for auto CPAP range 5 to 15 cm H2O with heated humidity and mask of choice.

## 2017-07-06 NOTE — Telephone Encounter (Signed)
Spoke with pt, who states he has spoken with [facility] who is requesting a new order for cpap machine. I have spoken with [place] w/ [facility], who confirmed a new order is all that is needed. Pt is a former [facility] pt. Order was placed to [facility] on 11/10/16 for cpap. Pt states he wanted to wait until after Christmas to start cpap, then he forgot about starting machine after the holidays.  Pt is now ready to proceed with therapy.  Pt has been scheduled with [facility] for 10/12/17 for consult and compliance.   VS please advise if okay to send order for auto 5-15cm, as recommended by [facility]. Thanks.

## 2017-07-09 NOTE — Telephone Encounter (Signed)
Orders have been signed and will be sent to [facility].

## 2017-09-28 ENCOUNTER — Inpatient Hospital Stay (HOSPITAL_BASED_OUTPATIENT_CLINIC_OR_DEPARTMENT_OTHER)
Admission: EM | Admit: 2017-09-28 | Discharge: 2017-10-01 | DRG: 378 | Disposition: A | Discharge status: Home / Self Care | Payer: BLUE CROSS/BLUE SHIELD | Attending: Family Medicine | Admitting: Family Medicine

## 2017-09-28 ENCOUNTER — Ambulatory Visit: Payer: BLUE CROSS/BLUE SHIELD | Admitting: Family Medicine

## 2017-09-28 ENCOUNTER — Encounter (HOSPITAL_BASED_OUTPATIENT_CLINIC_OR_DEPARTMENT_OTHER): Payer: Self-pay | Admitting: *Deleted

## 2017-09-28 DIAGNOSIS — K219 Gastro-esophageal reflux disease without esophagitis: Secondary | ICD-10-CM | POA: Diagnosis present

## 2017-09-28 DIAGNOSIS — Z9049 Acquired absence of other specified parts of digestive tract: Secondary | ICD-10-CM

## 2017-09-28 DIAGNOSIS — Z7982 Long term (current) use of aspirin: Secondary | ICD-10-CM

## 2017-09-28 DIAGNOSIS — Q230 Congenital stenosis of aortic valve: Secondary | ICD-10-CM

## 2017-09-28 DIAGNOSIS — I4581 Long QT syndrome: Secondary | ICD-10-CM | POA: Diagnosis present

## 2017-09-28 DIAGNOSIS — R55 Syncope and collapse: Secondary | ICD-10-CM | POA: Diagnosis present

## 2017-09-28 DIAGNOSIS — Z87891 Personal history of nicotine dependence: Secondary | ICD-10-CM

## 2017-09-28 DIAGNOSIS — R000 Tachycardia, unspecified: Secondary | ICD-10-CM | POA: Diagnosis present

## 2017-09-28 DIAGNOSIS — Z833 Family history of diabetes mellitus: Secondary | ICD-10-CM | POA: Diagnosis not present

## 2017-09-28 DIAGNOSIS — I248 Other forms of acute ischemic heart disease: Secondary | ICD-10-CM | POA: Diagnosis present

## 2017-09-28 DIAGNOSIS — K3189 Other diseases of stomach and duodenum: Secondary | ICD-10-CM | POA: Diagnosis present

## 2017-09-28 DIAGNOSIS — I959 Hypotension, unspecified: Secondary | ICD-10-CM | POA: Diagnosis present

## 2017-09-28 DIAGNOSIS — D62 Acute posthemorrhagic anemia: Secondary | ICD-10-CM

## 2017-09-28 DIAGNOSIS — Z8042 Family history of malignant neoplasm of prostate: Secondary | ICD-10-CM

## 2017-09-28 DIAGNOSIS — K2960 Other gastritis without bleeding: Secondary | ICD-10-CM | POA: Diagnosis present

## 2017-09-28 DIAGNOSIS — R9431 Abnormal electrocardiogram [ECG] [EKG]: Secondary | ICD-10-CM | POA: Diagnosis not present

## 2017-09-28 DIAGNOSIS — Z6841 Body Mass Index (BMI) 40.0 and over, adult: Secondary | ICD-10-CM

## 2017-09-28 DIAGNOSIS — K648 Other hemorrhoids: Secondary | ICD-10-CM | POA: Diagnosis present

## 2017-09-28 DIAGNOSIS — K922 Gastrointestinal hemorrhage, unspecified: Secondary | ICD-10-CM | POA: Diagnosis present

## 2017-09-28 DIAGNOSIS — D72829 Elevated white blood cell count, unspecified: Secondary | ICD-10-CM | POA: Diagnosis present

## 2017-09-28 DIAGNOSIS — G4733 Obstructive sleep apnea (adult) (pediatric): Secondary | ICD-10-CM | POA: Diagnosis present

## 2017-09-28 DIAGNOSIS — Z885 Allergy status to narcotic agent status: Secondary | ICD-10-CM

## 2017-09-28 DIAGNOSIS — E861 Hypovolemia: Secondary | ICD-10-CM | POA: Diagnosis present

## 2017-09-28 DIAGNOSIS — Z9081 Acquired absence of spleen: Secondary | ICD-10-CM

## 2017-09-28 DIAGNOSIS — D72828 Other elevated white blood cell count: Secondary | ICD-10-CM | POA: Diagnosis not present

## 2017-09-28 DIAGNOSIS — I350 Nonrheumatic aortic (valve) stenosis: Secondary | ICD-10-CM | POA: Diagnosis present

## 2017-09-28 DIAGNOSIS — Z800 Family history of malignant neoplasm of digestive organs: Secondary | ICD-10-CM

## 2017-09-28 DIAGNOSIS — K2970 Gastritis, unspecified, without bleeding: Secondary | ICD-10-CM | POA: Diagnosis not present

## 2017-09-28 DIAGNOSIS — K222 Esophageal obstruction: Secondary | ICD-10-CM | POA: Diagnosis present

## 2017-09-28 DIAGNOSIS — R748 Abnormal levels of other serum enzymes: Secondary | ICD-10-CM | POA: Diagnosis not present

## 2017-09-28 DIAGNOSIS — R7989 Other specified abnormal findings of blood chemistry: Secondary | ICD-10-CM | POA: Diagnosis present

## 2017-09-28 DIAGNOSIS — K2950 Unspecified chronic gastritis without bleeding: Secondary | ICD-10-CM | POA: Diagnosis not present

## 2017-09-28 DIAGNOSIS — K264 Chronic or unspecified duodenal ulcer with hemorrhage: Principal | ICD-10-CM | POA: Diagnosis present

## 2017-09-28 LAB — CBC
HCT: 26.5 % — ABNORMAL LOW (ref 39.0–52.0)
HCT: 21 % — ABNORMAL LOW (ref 39.0–52.0)
Hemoglobin: 8.6 g/dL — ABNORMAL LOW (ref 13.0–17.0)
Hemoglobin: 7 g/dL — ABNORMAL LOW (ref 13.0–17.0)
MCH: 30.5 pg (ref 26.0–34.0)
MCH: 31 pg (ref 26.0–34.0)
MCHC: 32.5 g/dL (ref 30.0–36.0)
MCHC: 33.3 g/dL (ref 30.0–36.0)
MCV: 94 fL (ref 78.0–100.0)
MCV: 92.9 fL (ref 78.0–100.0)
PLATELETS: 381 10*3/uL (ref 150–400)
PLATELETS: 310 10*3/uL (ref 150–400)
RBC: 2.82 MIL/uL — AB (ref 4.22–5.81)
RBC: 2.26 MIL/uL — ABNORMAL LOW (ref 4.22–5.81)
RDW: 15.6 % — ABNORMAL HIGH (ref 11.5–15.5)
RDW: 16.4 % — AB (ref 11.5–15.5)
WBC: 27 10*3/uL — AB (ref 4.0–10.5)
WBC: 22.1 10*3/uL — AB (ref 4.0–10.5)

## 2017-09-28 LAB — CBC WITH DIFFERENTIAL/PLATELET
BASOS PCT: 0 %
Basophils Absolute: 0 10*3/uL (ref 0.0–0.1)
EOS ABS: 0 10*3/uL (ref 0.0–0.7)
Eosinophils Relative: 0 %
HCT: 22.1 % — ABNORMAL LOW (ref 39.0–52.0)
Hemoglobin: 7.1 g/dL — ABNORMAL LOW (ref 13.0–17.0)
Lymphocytes Relative: 23 %
Lymphs Abs: 5.7 10*3/uL — ABNORMAL HIGH (ref 0.7–4.0)
MCH: 30.3 pg (ref 26.0–34.0)
MCHC: 32.1 g/dL (ref 30.0–36.0)
MCV: 94.4 fL (ref 78.0–100.0)
MONO ABS: 2 10*3/uL — AB (ref 0.1–1.0)
Monocytes Relative: 8 %
NEUTROS PCT: 69 %
Neutro Abs: 17.2 10*3/uL — ABNORMAL HIGH (ref 1.7–7.7)
PLATELETS: 328 10*3/uL (ref 150–400)
RBC: 2.34 MIL/uL — ABNORMAL LOW (ref 4.22–5.81)
RDW: 15.9 % — ABNORMAL HIGH (ref 11.5–15.5)
WBC: 24.9 10*3/uL — AB (ref 4.0–10.5)

## 2017-09-28 LAB — COMPREHENSIVE METABOLIC PANEL
ALT: 27 U/L (ref 17–63)
AST: 24 U/L (ref 15–41)
Albumin: 3.7 g/dL (ref 3.5–5.0)
Alkaline Phosphatase: 44 U/L (ref 38–126)
Anion gap: 6 (ref 5–15)
BUN: 50 mg/dL — ABNORMAL HIGH (ref 6–20)
CALCIUM: 8.1 mg/dL — AB (ref 8.9–10.3)
CHLORIDE: 108 mmol/L (ref 101–111)
CO2: 23 mmol/L (ref 22–32)
CREATININE: 1.07 mg/dL (ref 0.61–1.24)
Glucose, Bld: 128 mg/dL — ABNORMAL HIGH (ref 65–99)
POTASSIUM: 4 mmol/L (ref 3.5–5.1)
Sodium: 137 mmol/L (ref 135–145)
TOTAL PROTEIN: 6.5 g/dL (ref 6.5–8.1)
Total Bilirubin: 0.6 mg/dL (ref 0.3–1.2)

## 2017-09-28 LAB — DIFFERENTIAL
BASOS ABS: 0 10*3/uL (ref 0.0–0.1)
BASOS PCT: 0 %
EOS ABS: 0 10*3/uL (ref 0.0–0.7)
Eosinophils Relative: 0 %
Lymphocytes Relative: 16 %
Lymphs Abs: 4.3 10*3/uL — ABNORMAL HIGH (ref 0.7–4.0)
MONO ABS: 2.2 10*3/uL — AB (ref 0.1–1.0)
Monocytes Relative: 8 %
NEUTROS PCT: 76 %
Neutro Abs: 20.5 10*3/uL — ABNORMAL HIGH (ref 1.7–7.7)

## 2017-09-28 LAB — OCCULT BLOOD X 1 CARD TO LAB, STOOL: Fecal Occult Bld: POSITIVE — AB

## 2017-09-28 LAB — PREPARE RBC (CROSSMATCH)

## 2017-09-28 LAB — PROTIME-INR
INR: 1.18
Prothrombin Time: 14.9 seconds (ref 11.4–15.2)

## 2017-09-28 LAB — APTT: APTT: 26 s (ref 24–36)

## 2017-09-28 LAB — RETICULOCYTES
RBC.: 2.26 MIL/uL — ABNORMAL LOW (ref 4.22–5.81)
Retic Count, Absolute: 212.4 10*3/uL — ABNORMAL HIGH (ref 19.0–186.0)
Retic Ct Pct: 9.4 % — ABNORMAL HIGH (ref 0.4–3.1)

## 2017-09-28 MED ORDER — PANTOPRAZOLE SODIUM 40 MG IV SOLR
INTRAVENOUS | Status: AC
  Filled 2017-09-28: qty 40

## 2017-09-28 MED ORDER — ACETAMINOPHEN 650 MG RE SUPP: 650.0000 mg | Freq: Four times a day (QID) | RECTAL | Status: DC | PRN

## 2017-09-28 MED ORDER — ONDANSETRON HCL 4 MG PO TABS: 4.0000 mg | ORAL_TABLET | Freq: Four times a day (QID) | ORAL | Status: DC | PRN

## 2017-09-28 MED ORDER — ONDANSETRON HCL 4 MG/2ML IJ SOLN: 4.0000 mg | Freq: Four times a day (QID) | INTRAMUSCULAR | Status: DC | PRN

## 2017-09-28 MED ORDER — PANTOPRAZOLE SODIUM 40 MG IV SOLR
INTRAVENOUS | Status: AC
  Filled 2017-09-28: qty 80

## 2017-09-28 MED ORDER — SODIUM CHLORIDE 0.9 % IV SOLN
8.0000 mg/h | INTRAVENOUS | Status: DC
  Administered 2017-09-28 – 2017-09-30 (×6): 8 mg/h via INTRAVENOUS
  Filled 2017-09-28 (×11): qty 80

## 2017-09-28 MED ORDER — ACETAMINOPHEN 325 MG PO TABS
650.0000 mg | ORAL_TABLET | Freq: Four times a day (QID) | ORAL | Status: DC | PRN
  Filled 2017-09-28: qty 2

## 2017-09-28 MED ORDER — SODIUM CHLORIDE 0.9 % IV SOLN
Freq: Once | INTRAVENOUS | Status: AC
  Administered 2017-09-29: via INTRAVENOUS

## 2017-09-28 MED ORDER — ACETAMINOPHEN 325 MG PO TABS
650.0000 mg | ORAL_TABLET | Freq: Once | ORAL | Status: AC
  Administered 2017-09-28: 650 mg via ORAL
  Filled 2017-09-28: qty 2

## 2017-09-28 MED ORDER — SODIUM CHLORIDE 0.9 % IV BOLUS (SEPSIS)
1000.0000 mL | Freq: Once | INTRAVENOUS | Status: AC
  Administered 2017-09-28: 1000 mL via INTRAVENOUS

## 2017-09-28 MED ORDER — DIPHENHYDRAMINE HCL 25 MG PO CAPS
25.0000 mg | ORAL_CAPSULE | Freq: Once | ORAL | Status: AC
  Administered 2017-09-28: 25 mg via ORAL
  Filled 2017-09-28: qty 1

## 2017-09-28 MED ORDER — SODIUM CHLORIDE 0.9 % IV SOLN
INTRAVENOUS | Status: AC
  Administered 2017-09-28 – 2017-09-29 (×2): via INTRAVENOUS

## 2017-09-28 MED ORDER — SODIUM CHLORIDE 0.9 % IV SOLN
80.0000 mg | Freq: Once | INTRAVENOUS | Status: AC
  Administered 2017-09-28: 15:00:00 80 mg via INTRAVENOUS
  Filled 2017-09-28: qty 80

## 2017-09-28 NOTE — ED Provider Notes (Signed)
  Physical Exam  BP 112/66   Pulse (!) 113   Temp 98.4 F (36.9 C) (Oral)   Resp 19   Ht 6' (1.829 m)   Wt 136.1 kg (300 lb)   SpO2 99%   BMI 40.69 kg/m   Physical Exam  ED Course  Procedures  MDM Patient admitted to stepdown for GI bleed. I was called at 5:30 pm since patient dropped BP to the 80s. Appears pale. No active rectal bleeding. Will give another NS bolus and repeat CBC.   6:30 pm Repeat CBC showed Hg 7.1, down from 8.6. BP improved with NS 1 L bolus. Has a bed at stepdown at [facility]. Can transfer there and get transfusion at [facility]. There is only O neg blood at [facility], MD 09/28/17 [phone]

## 2017-09-28 NOTE — ED Notes (Signed)
Patient claimed of feeling week.

## 2017-09-28 NOTE — ED Notes (Signed)
ED Provider at bedside. 

## 2017-09-28 NOTE — Progress Notes (Signed)
Received call from Dr. Oleta Mouse [facility] ED physician) to admit Brian Kerr due to acute GIB. Patient presented with 3-4 days of melanotic stools and day prior to admission coffee ground emesis. On day of admission he had a brief episode of syncope. Patient found with Hgb of 8.6 (normal a year ago). Has seen [facility] in the past; call them when patient arrived. Positive melena on rectal exam. VS stable overall. 2 large bores IV in place, IV protonix started and patient type and screen. Accepted to stepdown bed.  Barton Dubois MD [phone]
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
## 2017-09-28 NOTE — ED Notes (Signed)
Spoke with [place] RN [facility]). States bed is ready in stepdown. [facility] notified of need for STAT transport

## 2017-09-28 NOTE — ED Triage Notes (Signed)
Possible GI bleed. Pale. He has had black stools and coffee ground emesis since yesterday. He passed out in the shower this am.

## 2017-09-28 NOTE — ED Notes (Signed)
Dr Oleta Mouse made aware of pt's Hbg 8.6 abd Hbg 27.0

## 2017-09-29 ENCOUNTER — Encounter (HOSPITAL_COMMUNITY): Payer: Self-pay | Admitting: Anesthesiology

## 2017-09-29 ENCOUNTER — Encounter (HOSPITAL_COMMUNITY): Payer: Self-pay | Admitting: Gastroenterology

## 2017-09-29 ENCOUNTER — Inpatient Hospital Stay (HOSPITAL_COMMUNITY): Payer: BLUE CROSS/BLUE SHIELD

## 2017-09-29 ENCOUNTER — Encounter (HOSPITAL_COMMUNITY)
Admission: EM | Disposition: A | Discharge status: Home / Self Care | Payer: Self-pay | Source: Home / Self Care | Attending: Family Medicine

## 2017-09-29 DIAGNOSIS — D72829 Elevated white blood cell count, unspecified: Secondary | ICD-10-CM

## 2017-09-29 DIAGNOSIS — R748 Abnormal levels of other serum enzymes: Secondary | ICD-10-CM

## 2017-09-29 DIAGNOSIS — Q231 Congenital insufficiency of aortic valve: Secondary | ICD-10-CM

## 2017-09-29 DIAGNOSIS — D62 Acute posthemorrhagic anemia: Secondary | ICD-10-CM

## 2017-09-29 DIAGNOSIS — R55 Syncope and collapse: Secondary | ICD-10-CM

## 2017-09-29 DIAGNOSIS — Q230 Congenital stenosis of aortic valve: Secondary | ICD-10-CM

## 2017-09-29 DIAGNOSIS — K264 Chronic or unspecified duodenal ulcer with hemorrhage: Principal | ICD-10-CM

## 2017-09-29 DIAGNOSIS — I350 Nonrheumatic aortic (valve) stenosis: Secondary | ICD-10-CM

## 2017-09-29 DIAGNOSIS — K3189 Other diseases of stomach and duodenum: Secondary | ICD-10-CM

## 2017-09-29 DIAGNOSIS — K922 Gastrointestinal hemorrhage, unspecified: Secondary | ICD-10-CM

## 2017-09-29 DIAGNOSIS — K2970 Gastritis, unspecified, without bleeding: Secondary | ICD-10-CM

## 2017-09-29 HISTORY — PX: ESOPHAGOGASTRODUODENOSCOPY: SHX5428

## 2017-09-29 LAB — IRON AND TIBC
IRON: 12 ug/dL — AB (ref 45–182)
Saturation Ratios: 4 % — ABNORMAL LOW (ref 17.9–39.5)
TIBC: 318 ug/dL (ref 250–450)
UIBC: 306 ug/dL

## 2017-09-29 LAB — CBC
HCT: 24.1 % — ABNORMAL LOW (ref 39.0–52.0)
HCT: 24.4 % — ABNORMAL LOW (ref 39.0–52.0)
HCT: 24.8 % — ABNORMAL LOW (ref 39.0–52.0)
HEMATOCRIT: 23.7 % — AB (ref 39.0–52.0)
HEMOGLOBIN: 8.1 g/dL — AB (ref 13.0–17.0)
HEMOGLOBIN: 8.1 g/dL — AB (ref 13.0–17.0)
Hemoglobin: 8 g/dL — ABNORMAL LOW (ref 13.0–17.0)
Hemoglobin: 8.2 g/dL — ABNORMAL LOW (ref 13.0–17.0)
MCH: 30.6 pg (ref 26.0–34.0)
MCH: 30.7 pg (ref 26.0–34.0)
MCH: 30.3 pg (ref 26.0–34.0)
MCH: 29.7 pg (ref 26.0–34.0)
MCHC: 33.6 g/dL (ref 30.0–36.0)
MCHC: 33.8 g/dL (ref 30.0–36.0)
MCHC: 33.2 g/dL (ref 30.0–36.0)
MCHC: 33.1 g/dL (ref 30.0–36.0)
MCV: 90.9 fL (ref 78.0–100.0)
MCV: 90.8 fL (ref 78.0–100.0)
MCV: 91.4 fL (ref 78.0–100.0)
MCV: 89.9 fL (ref 78.0–100.0)
PLATELETS: 290 10*3/uL (ref 150–400)
PLATELETS: 309 10*3/uL (ref 150–400)
PLATELETS: 233 10*3/uL (ref 150–400)
Platelets: 276 10*3/uL (ref 150–400)
RBC: 2.65 MIL/uL — AB (ref 4.22–5.81)
RBC: 2.61 MIL/uL — ABNORMAL LOW (ref 4.22–5.81)
RBC: 2.67 MIL/uL — AB (ref 4.22–5.81)
RBC: 2.76 MIL/uL — AB (ref 4.22–5.81)
RDW: 17 % — ABNORMAL HIGH (ref 11.5–15.5)
RDW: 17.1 % — ABNORMAL HIGH (ref 11.5–15.5)
RDW: 17.2 % — ABNORMAL HIGH (ref 11.5–15.5)
RDW: 17.1 % — ABNORMAL HIGH (ref 11.5–15.5)
WBC: 16.6 10*3/uL — AB (ref 4.0–10.5)
WBC: 14.5 10*3/uL — AB (ref 4.0–10.5)
WBC: 13.2 10*3/uL — AB (ref 4.0–10.5)
WBC: 12.2 10*3/uL — AB (ref 4.0–10.5)

## 2017-09-29 LAB — URINALYSIS, ROUTINE W REFLEX MICROSCOPIC
BILIRUBIN URINE: NEGATIVE
Glucose, UA: NEGATIVE mg/dL
HGB URINE DIPSTICK: NEGATIVE
Ketones, ur: NEGATIVE mg/dL
Leukocytes, UA: NEGATIVE
NITRITE: NEGATIVE
Protein, ur: NEGATIVE mg/dL
Specific Gravity, Urine: 1.023 (ref 1.005–1.030)
pH: 5 (ref 5.0–8.0)

## 2017-09-29 LAB — COMPREHENSIVE METABOLIC PANEL
ALBUMIN: 3.1 g/dL — AB (ref 3.5–5.0)
ALT: 21 U/L (ref 17–63)
AST: 27 U/L (ref 15–41)
Alkaline Phosphatase: 45 U/L (ref 38–126)
Anion gap: 7 (ref 5–15)
BUN: 30 mg/dL — AB (ref 6–20)
CALCIUM: 7.4 mg/dL — AB (ref 8.9–10.3)
CHLORIDE: 110 mmol/L (ref 101–111)
CO2: 22 mmol/L (ref 22–32)
Creatinine, Ser: 0.94 mg/dL (ref 0.61–1.24)
GFR calc Af Amer: >60 mL/min (ref >60)
GFR calc non Af Amer: >60 mL/min (ref >60)
GLUCOSE: 118 mg/dL — AB (ref 65–99)
POTASSIUM: 4 mmol/L (ref 3.5–5.1)
SODIUM: 139 mmol/L (ref 135–145)
TOTAL PROTEIN: 5.4 g/dL — AB (ref 6.5–8.1)
Total Bilirubin: 1 mg/dL (ref 0.3–1.2)

## 2017-09-29 LAB — ABO/RH: ABO/RH(D): O POS

## 2017-09-29 LAB — LIPID PANEL
CHOL/HDL RATIO: 8.9 ratio
Cholesterol: 133 mg/dL (ref 0–200)
HDL: 15 mg/dL — ABNORMAL LOW (ref >40)
LDL CALC: 62 mg/dL (ref 0–99)
Triglycerides: 280 mg/dL — ABNORMAL HIGH (ref <150)
VLDL: 56 mg/dL — AB (ref 0–40)

## 2017-09-29 LAB — PHOSPHORUS
PHOSPHORUS: 2.9 mg/dL (ref 2.5–4.6)
PHOSPHORUS: 3.2 mg/dL (ref 2.5–4.6)

## 2017-09-29 LAB — ECHOCARDIOGRAM COMPLETE
Height: 72 in
Weight: 4843.07 oz

## 2017-09-29 LAB — TROPONIN I
TROPONIN I: 1.74 ng/mL — AB (ref <0.03)
Troponin I: 0.56 ng/mL (ref <0.03)
Troponin I: 1.71 ng/mL (ref <0.03)

## 2017-09-29 LAB — VITAMIN B12: VITAMIN B 12: 238 pg/mL (ref 180–914)

## 2017-09-29 LAB — FERRITIN: FERRITIN: 22 ng/mL — AB (ref 24–336)

## 2017-09-29 LAB — MAGNESIUM
Magnesium: 1.9 mg/dL (ref 1.7–2.4)
Magnesium: 1.9 mg/dL (ref 1.7–2.4)

## 2017-09-29 LAB — TSH: TSH: 3.538 u[IU]/mL (ref 0.350–4.500)

## 2017-09-29 LAB — HIV ANTIBODY (ROUTINE TESTING W REFLEX): HIV SCREEN 4TH GENERATION: NONREACTIVE

## 2017-09-29 LAB — FOLATE: Folate: 10.1 ng/mL (ref >5.9)

## 2017-09-29 SURGERY — EGD (ESOPHAGOGASTRODUODENOSCOPY)
Anesthesia: Moderate Sedation

## 2017-09-29 MED ORDER — MIDAZOLAM HCL 10 MG/2ML IJ SOLN
INTRAMUSCULAR | Status: DC | PRN
  Administered 2017-09-29: 1 mg via INTRAVENOUS
  Administered 2017-09-29: 2 mg via INTRAVENOUS
  Administered 2017-09-29: 1 mg via INTRAVENOUS
  Administered 2017-09-29: 2 mg via INTRAVENOUS

## 2017-09-29 MED ORDER — BUTAMBEN-TETRACAINE-BENZOCAINE 2-2-14 % EX AERO
INHALATION_SPRAY | CUTANEOUS | Status: DC | PRN
  Administered 2017-09-29: 2 via TOPICAL

## 2017-09-29 MED ORDER — DIPHENHYDRAMINE HCL 50 MG/ML IJ SOLN
INTRAMUSCULAR | Status: AC
  Filled 2017-09-29: qty 1

## 2017-09-29 MED ORDER — MIDAZOLAM HCL 5 MG/ML IJ SOLN
INTRAMUSCULAR | Status: AC
  Filled 2017-09-29: qty 2

## 2017-09-29 MED ORDER — FENTANYL CITRATE (PF) 100 MCG/2ML IJ SOLN
INTRAMUSCULAR | Status: DC | PRN
  Administered 2017-09-29 (×3): 25 ug via INTRAVENOUS

## 2017-09-29 MED ORDER — FENTANYL CITRATE (PF) 100 MCG/2ML IJ SOLN
INTRAMUSCULAR | Status: AC
  Filled 2017-09-29: qty 2

## 2017-09-29 MED ORDER — PERFLUTREN LIPID MICROSPHERE
1.0000 mL | INTRAVENOUS | Status: AC | PRN
  Administered 2017-09-29: 2 mL via INTRAVENOUS
  Filled 2017-09-29: qty 10

## 2017-09-29 NOTE — Progress Notes (Signed)
  Echocardiogram 2D Echocardiogram with Definity has been performed.  Tresa Res 09/29/2017, 10:21 AM
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
## 2017-09-29 NOTE — Op Note (Signed)
[facility] Patient Name: Brian Kerr Procedure Date: 09/29/2017 MRN: [id number] Attending MD: [name] , MD Date of Birth: 1976-03-20 CSN: [id number] Age: 41 Admit Type: Inpatient Procedure:                Upper GI endoscopy Indications:              Hematemesis, Melena Providers:                Pricilla Riffle. Fuller Plan, MD, Carolynn Comment RN, RN,                            William Dalton, Technician Referring MD:             [facility] Medicines:                Fentanyl 75 micrograms IV, Midazolam 6 mg IV Complications:            No immediate complications. Estimated Blood Loss:     Estimated blood loss: none. Procedure:                Pre-Anesthesia Assessment:                           - Prior to the procedure, a History and Physical                            was performed, and patient medications and                            allergies were reviewed. The patient's tolerance of                            previous anesthesia was also reviewed. The risks                            and benefits of the procedure and the sedation                            options and risks were discussed with the patient.                            All questions were answered, and informed consent                            was obtained. Prior Anticoagulants: The patient has                            taken no previous anticoagulant or antiplatelet                            agents. ASA Grade Assessment: III - A patient with                            severe systemic disease. After reviewing the risks  and benefits, the patient was deemed in                            satisfactory condition to undergo the procedure.                           After obtaining informed consent, the endoscope was                            passed under direct vision. Throughout the                            procedure, the patient's blood pressure, pulse, and                      oxygen saturations were monitored continuously. The                            EG-[id number] ([id number]) scope was introduced through the                            mouth, and advanced to the second part of duodenum.                            The upper GI endoscopy was somewhat difficult due                            to abnormal anatomy. The patient tolerated the                            procedure well. Scope In: Scope Out: Findings:      One mild benign-appearing, intrinsic stenosis was found at the       gastroesophageal junction. This measured 1.4 cm (inner diameter) and was       traversed.      The exam of the esophagus was otherwise normal.      Multiple localized, small non-bleeding erosions were found in the       gastric antrum. There were no stigmata of recent bleeding. Biopsies were       taken with a cold forceps for Helicobacter pylori testing.      The exam of the stomach was otherwise normal.      One non-bleeding cratered duodenal ulcer with a visible vessel was found       in the second portion of the duodenum. The lesion was 8 mm in largest       dimension. It was located on the curve just beyond the bulb and the       lumen was narrowed from edema. It was techically somewhat difficult to       maintain an adequate position. Area was injected with 4 mL of a 1:10,000       solution of epinephrine for hemostasis. Coagulation for hemostasis using       bipolar probe was successful. The vissible vessel was ablated.      Patchy mildly erythematous mucosa without active bleeding and with no       stigmata of bleeding was found in the duodenal bulb.  Impression:               - Benign-appearing esophageal stenosis.                           - Non-bleeding erosive gastropathy. Biopsied.                           - One non-bleeding duodenal ulcer with a visible                            vessel. Treated with epinephrine injection and                             bipolar cautery.                           - Erythematous duodenopathy. Moderate Sedation:      Moderate (conscious) sedation was administered by the endoscopy nurse       and supervised by the endoscopist. The following parameters were       monitored: oxygen saturation, heart rate, blood pressure, respiratory       rate, EKG, adequacy of pulmonary ventilation, and response to care.       Total physician intraservice time was 21 minutes. Recommendation:           - Return patient to ICU for ongoing care.                           - Clear liquid diet today.                           - Protonix (pantoprazole): 8 mg/hr IV by continuous                            infusion for 3 days. Then PPI po bid.                           - Await pathology results.                           - No aspirin, ibuprofen, naproxen, or other                            non-steroidal anti-inflammatory drugs.                           - Outpatient GI follow up with Dr. Scarlette Shorts. Procedure Code(s):        --- Professional ---                           19147, 45, Esophagogastroduodenoscopy, flexible,                            transoral; with control of bleeding, any method                           43239,  Esophagogastroduodenoscopy, flexible,                            transoral; with biopsy, single or multiple Diagnosis Code(s):        --- Professional ---                           K22.2, Esophageal obstruction                           K31.89, Other diseases of stomach and duodenum                           K26.4, Chronic or unspecified duodenal ulcer with                            hemorrhage                           K92.0, Hematemesis                           K92.1, Melena (includes Hematochezia) CPT copyright 2016 [employer]. All rights reserved. The codes documented in this report are preliminary and upon coder review may  be revised to meet current compliance requirements. [name], MD 09/29/2017 11:13:15 AM This report has been signed electronically. Number of Addenda: 0

## 2017-09-29 NOTE — Progress Notes (Signed)
Bedside endoscopy performed. Report given to Shanon Brow, ICU RN.

## 2017-09-29 NOTE — Progress Notes (Signed)
CRITICAL VALUE ALERT  Critical Value:  Troponin 0.56  Date & Time Notied:  09/29/17 @ 0010   Provider Notified: Dr Roel Cluck  Orders Received/Actions taken: EKG now, Echo in AM

## 2017-09-29 NOTE — Progress Notes (Signed)
CRITICAL VALUE ALERT  Critical Value:  Troponin 1.74  Date & Time Notied:  09/29/2017  1001  Provider Notified: Dr. Bonner Puna  Orders Received/Actions taken: See orders
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
## 2017-09-29 NOTE — Interval H&P Note (Signed)
History and Physical Interval Note:  09/29/2017 10:12 AM  Brian Kerr  has presented today for surgery, with the diagnosis of GI bleed  The various methods of treatment have been discussed with the patient and family. After consideration of risks, benefits and other options for treatment, the patient has consented to  Procedure(s): ESOPHAGOGASTRODUODENOSCOPY (EGD) (N/A) as a surgical intervention .  The patient's history has been reviewed, patient examined, no change in status, stable for surgery.  I have reviewed the patient's chart and labs.  Questions were answered to the patient's satisfaction.     Pricilla Riffle. Fuller Plan

## 2017-09-30 DIAGNOSIS — K222 Esophageal obstruction: Secondary | ICD-10-CM

## 2017-09-30 LAB — CBC
HEMATOCRIT: 24.7 % — AB (ref 39.0–52.0)
HEMOGLOBIN: 8.1 g/dL — AB (ref 13.0–17.0)
MCH: 29.9 pg (ref 26.0–34.0)
MCHC: 32.8 g/dL (ref 30.0–36.0)
MCV: 91.1 fL (ref 78.0–100.0)
Platelets: 324 10*3/uL (ref 150–400)
RBC: 2.71 MIL/uL — AB (ref 4.22–5.81)
RDW: 17.2 % — AB (ref 11.5–15.5)
WBC: 13.2 10*3/uL — AB (ref 4.0–10.5)

## 2017-09-30 MED ORDER — SODIUM CHLORIDE 0.9 % IJ SOLN
PREFILLED_SYRINGE | INTRAMUSCULAR | Status: DC | PRN
  Administered 2017-09-29: 4 mL
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
## 2017-09-30 NOTE — Progress Notes (Signed)
    Progress Note   Subjective  No bleeding overnight. Hungry.    Objective  Vital signs in last 24 hours: Temp:  [98 F (36.7 C)-99.2 F (37.3 C)] 98 F (36.7 C) (10/07 0800) Pulse Rate:  [90-115] 90 (10/07 0800) Resp:  [16-22] 18 (10/07 0800) BP: (97-163)/(45-94) 120/75 (10/07 0800) SpO2:  [98 %-100 %] 98 % (10/07 0800) Last BM Date: 09/29/17  General: Alert, well-developed, in NAD Heart:  Regular rate and rhythm; no murmurs Chest: Clear to ascultation bilaterally Abdomen:  Soft, nontender and nondistended. Normal bowel sounds, without guarding, and without rebound.   Extremities:  Without edema. Neurologic:  Alert and  oriented x4; grossly normal neurologically. Psych:  Alert and cooperative. Normal mood and affect.  Intake/Output from previous day: 10/06 0701 - 10/07 0700 In: 880 [P.O.:480; I.V.:400] Out: 950 [Urine:950] Intake/Output this shift: No intake/output data recorded.  Lab Results:  Recent Labs  09/29/17 1555 09/29/17 2232 09/30/17 0331  WBC 13.2* 12.2* 13.2*  HGB 8.1* 8.2* 8.1*  HCT 24.4* 24.8* 24.7*  PLT 309 233 324   BMET  Recent Labs  09/28/17 1336 09/29/17 0505  NA 137 139  K 4.0 4.0  CL 108 110  CO2 23 22  GLUCOSE 128* 118*  BUN 50* 30*  CREATININE 1.07 0.94  CALCIUM 8.1* 7.4*   LFT  Recent Labs  09/29/17 0505  PROT 5.4*  ALBUMIN 3.1*  AST 27  ALT 21  ALKPHOS 45  BILITOT 1.0   PT/INR  Recent Labs  09/28/17 2307  LABPROT 14.9  INR 1.18      Assessment & Plan   1. Duodenal ulcer with bleeding. Visible vessel treated. No signs of rebleeding. Continue IV PPI infusion for total of 3 days, then PPI po bid. Awaiting path report from gastric biopsies. Advance to full liquids as tolerated.  2. ABL anemia. Hb now stable at 8.1 post transfusion. Trend CBC.  3. Erosive gastritis.  4. Esophageal stricture.     LOS: 2 days 1.   Brian Kerr. Fuller Plan MD 09/30/2017, 9:52 AM

## 2017-09-30 NOTE — Progress Notes (Addendum)
Progress Note  Patient Name: Brian Kerr Date of Encounter: 09/30/2017  Primary Cardiologist: Linard Millers  Subjective   No cardiac complaints.  Inpatient Medications    Scheduled Meds:  Continuous Infusions: . pantoprozole (PROTONIX) infusion 8 mg/hr (09/30/17 0137)   PRN Meds: acetaminophen **OR** acetaminophen   Vital Signs    Vitals:   09/29/17 2200 09/30/17 0000 09/30/17 0300 09/30/17 0800  BP: (!) 146/88     Pulse: 96     Resp: (!) 22     Temp:  98.4 F (36.9 C) 98.3 F (36.8 C) 98 F (36.7 C)  TempSrc:  Axillary Axillary Oral  SpO2: 100%     Weight:      Height:        Intake/Output Summary (Last 24 hours) at 09/30/17 0821 Last data filed at 09/30/17 0300  Gross per 24 hour  Intake              880 ml  Output              950 ml  Net              -70 ml   Filed Weights   09/28/17 1257 09/28/17 2007  Weight: 300 lb (136.1 kg) (!) 302 lb 11.1 oz (137.3 kg)    Telemetry    Normal sinus rhythm and sinus tachycardia - Personally Reviewed  ECG    No new tracing - Personally Reviewed  Physical Exam  Obese, no distress. GEN: No acute distress.   Neck: No JVD Cardiac: RRR, rubs, or gallops. 3/6 crescendo decrescendo systolic murmur consistent with aortic stenosis. No aortic regurgitation is heard. Respiratory: Clear to auscultation bilaterally. GI: Soft, nontender, non-distended  MS: No edema; No deformity. Neuro:  Nonfocal  Psych: Normal affect   Labs    Chemistry Recent Labs Lab 09/28/17 1336 09/29/17 0505  NA 137 139  K 4.0 4.0  CL 108 110  CO2 23 22  GLUCOSE 128* 118*  BUN 50* 30*  CREATININE 1.07 0.94  CALCIUM 8.1* 7.4*  PROT 6.5 5.4*  ALBUMIN 3.7 3.1*  AST 24 27  ALT 27 21  ALKPHOS 44 45  BILITOT 0.6 1.0  GFRNONAA >60 >60  GFRAA >60 >60  ANIONGAP 6 7     Hematology Recent Labs Lab 09/29/17 1555 09/29/17 2232 09/30/17 0331  WBC 13.2* 12.2* 13.2*  RBC 2.67* 2.76* 2.71*  HGB 8.1* 8.2* 8.1*  HCT 24.4* 24.8* 24.7*    MCV 91.4 89.9 91.1  MCH 30.3 29.7 29.9  MCHC 33.2 33.1 32.8  RDW 17.2* 17.1* 17.2*  PLT 309 233 324    Cardiac Enzymes Recent Labs Lab 09/28/17 2307 09/29/17 0505 09/29/17 0903  TROPONINI 0.56* 1.71* 1.74*   No results for input(s): TROPIPOC in the last 168 hours.   BNPNo results for input(s): BNP, PROBNP in the last 168 hours.   DDimer No results for input(s): DDIMER in the last 168 hours.   Radiology    No results found.  Cardiac Studies   Echocardiogram 09/29/2017: ------------------------------------------------------------------- Study Conclusions  - Left ventricle: The cavity size was normal. There was moderate   concentric hypertrophy. Systolic function was normal. The   estimated ejection fraction was in the range of 60% to 65%. Wall   motion was normal; there were no regional wall motion   abnormalities. - Aortic valve: There was moderate stenosis. There was no   regurgitation. Peak velocity (S): 329 cm/s. Mean gradient (S): 27  mm Hg. Valve area (VTI): 0.96 cm^2. Valve area (Vmax): 1.05 cm^2.   Valve area (Vmean): 1 cm^2. - Left atrium: The atrium was severely dilated. - Right ventricle: The cavity size was normal. Wall thickness was   normal. Systolic function was normal. - Tricuspid valve: There was no regurgitation.  Impressions:  - The aortic valve is poorly-visualized, but the gradients across   the aortic valve are moderately elevated. Would consider TEE or   cardiac MRI to better evaluate.  Patient Profile     41 y.o. male who presented with upper GI bleeding, severe anemia, syncope, elevated troponin I, and systolic murmur. Cardiology was asked to see the patient because of elevated troponin.  Assessment & Plan    1. Upper GI bleed with severe anemia on presentation. Hemoglobin is currently stable at 8.1 2. Syncope secondary to moderate aortic stenosis, hypotension from GI bleeding, and anemia. 3. Moderately severe aortic stenosis  likely secondary to bicuspid aortic valve. Cardiac echo needs to be repeated once anemia is completely resolved. Anemia is currently contributing to increased velocity/high-output causing severe urinary asked to be overestimated. I discussed the cardinal features/symptoms of aortic stenosis with the patient-angina, syncope, and dyspnea. He will need longitudinal follow-up. He should have cardiology follow-up in 3 months at which time a repeat echo will be scheduled. If he has bicuspid aortic valve, he will also need to have ascending aorta followed for development of aneurysm/enlargement. 4. Elevated troponin is secondary to anemia in the setting of moderate LVH, aortic stenosis, and initial hypotension from GI bleeding. Ischemic evaluation is not indicated.  For questions or updates, please contact [place] Please consult [email or web address] for contact info under Cardiology/STEMI.      Signed, Sinclair Grooms, MD  09/30/2017, 8:21 AM

## 2017-10-01 ENCOUNTER — Other Ambulatory Visit: Payer: Self-pay | Admitting: Physician Assistant

## 2017-10-01 ENCOUNTER — Encounter (HOSPITAL_COMMUNITY): Payer: Self-pay | Admitting: Gastroenterology

## 2017-10-01 ENCOUNTER — Encounter: Payer: Self-pay | Admitting: Family Medicine

## 2017-10-01 DIAGNOSIS — Q230 Congenital stenosis of aortic valve: Secondary | ICD-10-CM

## 2017-10-01 DIAGNOSIS — K2950 Unspecified chronic gastritis without bleeding: Secondary | ICD-10-CM

## 2017-10-01 DIAGNOSIS — Q231 Congenital insufficiency of aortic valve: Principal | ICD-10-CM

## 2017-10-01 LAB — TYPE AND SCREEN
ABO/RH(D): O POS
ANTIBODY SCREEN: NEGATIVE
UNIT DIVISION: 0
Unit division: 0

## 2017-10-01 LAB — BPAM RBC
Blood Product Expiration Date: 201810222359
Blood Product Expiration Date: 201810222359
ISSUE DATE / TIME: 201810052357
ISSUE DATE / TIME: 201810060204
UNIT TYPE AND RH: 5100
Unit Type and Rh: 5100

## 2017-10-01 LAB — CBC
HCT: 24.9 % — ABNORMAL LOW (ref 39.0–52.0)
Hemoglobin: 8.4 g/dL — ABNORMAL LOW (ref 13.0–17.0)
MCH: 30.5 pg (ref 26.0–34.0)
MCHC: 33.7 g/dL (ref 30.0–36.0)
MCV: 90.5 fL (ref 78.0–100.0)
PLATELETS: 383 10*3/uL (ref 150–400)
RBC: 2.75 MIL/uL — AB (ref 4.22–5.81)
RDW: 16.6 % — AB (ref 11.5–15.5)
WBC: 11.8 10*3/uL — AB (ref 4.0–10.5)

## 2017-10-01 LAB — PATHOLOGIST SMEAR REVIEW

## 2017-10-01 MED ORDER — PANTOPRAZOLE SODIUM 40 MG PO TBEC: 40.0000 mg | DELAYED_RELEASE_TABLET | Freq: Two times a day (BID) | ORAL | Status: DC

## 2017-10-01 MED ORDER — EPINEPHRINE PF 1 MG/10ML IJ SOSY
PREFILLED_SYRINGE | INTRAMUSCULAR | Status: AC
  Filled 2017-10-01: qty 10

## 2017-10-01 MED ORDER — PANTOPRAZOLE SODIUM 40 MG PO TBEC: 40.0000 mg | DELAYED_RELEASE_TABLET | Freq: Two times a day (BID) | ORAL | 0 refills | Status: DC

## 2017-10-01 MED ORDER — FERROUS SULFATE 325 (65 FE) MG PO TBEC: 325.0000 mg | DELAYED_RELEASE_TABLET | Freq: Two times a day (BID) | ORAL | 0 refills | Status: DC

## 2017-10-01 NOTE — Plan of Care (Signed)
Problem: Nutrition: Goal: Adequate nutrition will be maintained Outcome: Not Progressing Pt is only on F liquids

## 2017-10-01 NOTE — Progress Notes (Signed)
    Progress Note   Subjective  Chief Complaint: Bleeding Duodenal Ulcer  Pt doing well this morning, sitting up in chair by bedside with wife in room, eagerly anticipating discharge this morning. No further melena, no abdominal pain. No new complaints.    Objective   Vital signs in last 24 hours: Temp:  [98.1 F (36.7 C)-98.4 F (36.9 C)] 98.2 F (36.8 C) (10/08 0531) Pulse Rate:  [79-100] 79 (10/08 0531) Resp:  [18] 18 (10/08 0531) BP: (123-141)/(71-85) 123/77 (10/08 0531) SpO2:  [99 %-100 %] 99 % (10/08 0531) Last BM Date: 09/30/17 (per patient. ) General:    Caucasian male in NAD Heart:  Regular rate and rhythm; no murmurs Lungs: Respirations even and unlabored, lungs CTA bilaterally Abdomen:  Soft, nontender and nondistended. Normal bowel sounds. Extremities:  Without edema. Neurologic:  Alert and oriented,  grossly normal neurologically. Psych:  Cooperative. Normal mood and affect.  Intake/Output from previous day: 10/07 0701 - 10/08 0700 In: 419 [P.O.:240; I.V.:415] Out: -   Lab Results:  Recent Labs  09/29/17 2232 09/30/17 0331 10/01/17 0506  WBC 12.2* 13.2* 11.8*  HGB 8.2* 8.1* 8.4*  HCT 24.8* 24.7* 24.9*  PLT 233 324 383   BMET  Recent Labs  09/28/17 1336 09/29/17 0505  NA 137 139  K 4.0 4.0  CL 108 110  CO2 23 22  GLUCOSE 128* 118*  BUN 50* 30*  CREATININE 1.07 0.94  CALCIUM 8.1* 7.4*   LFT  Recent Labs  09/29/17 0505  PROT 5.4*  ALBUMIN 3.1*  AST 27  ALT 21  ALKPHOS 45  BILITOT 1.0   PT/INR  Recent Labs  09/28/17 2307  LABPROT 14.9  INR 1.18     Assessment / Plan:    1. Duodenal Ulcer with bleeding: visible vessel treated 09/29/17, no signs of re-bleeding, recommend a total of 72 hours of PPI infusion-per chart started at 1500 on 09/28/17- so could be stopped at 1500 today-then 40mg  PPI PO BID-advance to regular diet today- can d/c later today if stable 2. ABL anemia: Hb up to 8.4 this morning-stable 3. Erosive  Gastritis 4. Esophageal Stricture  Discharge Planning Diet: Regular- anti-reflux diet, d/c Excedrin use-can use Tylenol Anticoagulation and antiplatelets: n/a Discharge Medications: Pantoprazole 40mg  PO BID Follow up: in 3-4 weeks with Tye Savoy NP-C  Thank you for your kind consultation. We will sign off today.   LOS: 3 days   Levin Erp  10/01/2017, 9:30 AM  Pager # [phone]   Attending physician's note   I have taken an interval history, reviewed the chart and examined the patient. I agree with the Advanced Practitioner's note, impression and recommendations.  Patient feeling much better, vitals and hemoglobin stable Advance diet as tolerated to soft foods Continue PPI drip for 72 hours and transition to high dose PPI 40 mg twice daily before breakfast and dinner Will follow up H. Pylori status and treat if positive Oral iron, ferrous sulfate 325 mg 3 times daily with meals Recheck hemoglobin in 7-10 days Avoid NSAIDs Okay to discharge home from [place], MD [phone] Mon-Fri 8a-5p [phone] after 5p, weekends, holidays

## 2017-10-01 NOTE — Care Management Note (Signed)
Case Management Note  Patient Details  Name: Brian Kerr MRN: [id number] Date of Birth: 12-Feb-1976  Subjective/Objective: 41 y/o m admitted w/Duodenal ulcer. From home.                   Action/Plan:d/c plan home.   Expected Discharge Date:  10/01/17               Expected Discharge Plan:  Home/Self Care  In-House Referral:     Discharge planning Services  CM Consult  Post Acute Care Choice:    Choice offered to:     DME Arranged:    DME Agency:     HH Arranged:    HH Agency:     Status of Service:  Completed, signed off  If discussed at [employer] of Stay Meetings, dates discussed:    Additional Comments:  Dessa Phi, RN 10/01/2017, 1:10 PM

## 2017-10-01 NOTE — Progress Notes (Signed)
Message sent to office for f/u appt and repeat echo ordered for 3 months.  Call w/ questions.  Rosaria Ferries, Hershal Coria 10/01/2017 9:16 AM Beeper [phone]

## 2017-10-01 NOTE — Progress Notes (Signed)
Nutrition Brief Note  Patient identified on the Malnutrition Screening Tool (MST) Report  Wt Readings from Last 15 Encounters:  09/28/17 (!) 302 lb 11.1 oz (137.3 kg)  11/10/16 (!) 312 lb (141.5 kg)  11/07/16 (!) 312 lb (141.5 kg)  10/04/16 (!) 312 lb 6.4 oz (141.7 kg)  09/27/16 (!) 312 lb 9.6 oz (141.8 kg)  06/29/16 (!) 308 lb 6.4 oz (139.9 kg)  06/16/16 (!) 311 lb (141.1 kg)  06/14/16 (!) 312 lb 12.8 oz (141.9 kg)    Body mass index is 41.05 kg/m. Patient meets criteria for morbid obesity based on current BMI. No weight hx available in the chart other than values listed in the table above. Skin WDL. Pt admitted on 10/5 with coffee-ground emesis and 3 day hx of melena. He experience syncope x1 the day of admission and has been experiencing overall weakness and lightheadedness for several days. GI PA note from this AM states "eagerly anticipating discharge this morning." Plan to d/c later today if pt is/remains stable and have follow-up appointment in 3-4 weeks.   Current diet order is FLD with plan to advance to Regular diet shortly. Patient is consuming approximately 100 % of meals at this time. Labs and medications reviewed. No nutrition interventions warranted at this time. If nutrition issues arise, please consult RD.     Jarome Matin, MS, RD, LDN, [facility] Inpatient Clinical Dietitian Pager # [phone] After hours/weekend pager # [phone]
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
## 2017-10-04 ENCOUNTER — Encounter: Payer: Self-pay | Admitting: Gastroenterology

## 2017-10-05 ENCOUNTER — Other Ambulatory Visit: Payer: BLUE CROSS/BLUE SHIELD

## 2017-10-05 DIAGNOSIS — K264 Chronic or unspecified duodenal ulcer with hemorrhage: Secondary | ICD-10-CM

## 2017-10-07 LAB — CMP14+EGFR
A/G RATIO: 1.6 (ref 1.2–2.2)
ALK PHOS: 60 IU/L (ref 39–117)
ALT: 22 IU/L (ref 0–44)
AST: 22 IU/L (ref 0–40)
Albumin: 3.9 g/dL (ref 3.5–5.5)
BUN/Creatinine Ratio: 14 (ref 9–20)
BUN: 13 mg/dL (ref 6–24)
Bilirubin Total: 0.4 mg/dL (ref 0.0–1.2)
CHLORIDE: 104 mmol/L (ref 96–106)
CO2: 20 mmol/L (ref 20–29)
Calcium: 8.7 mg/dL (ref 8.7–10.2)
Creatinine, Ser: 0.93 mg/dL (ref 0.76–1.27)
GFR calc Af Amer: 117 mL/min/{1.73_m2} (ref >59)
GFR calc non Af Amer: 101 mL/min/{1.73_m2} (ref >59)
GLUCOSE: 84 mg/dL (ref 65–99)
Globulin, Total: 2.4 g/dL (ref 1.5–4.5)
POTASSIUM: 4.9 mmol/L (ref 3.5–5.2)
Sodium: 140 mmol/L (ref 134–144)
TOTAL PROTEIN: 6.3 g/dL (ref 6.0–8.5)

## 2017-10-07 LAB — CBC WITH DIFFERENTIAL/PLATELET
BASOS ABS: 0.1 10*3/uL (ref 0.0–0.2)
Basos: 1 %
EOS (ABSOLUTE): 0.3 10*3/uL (ref 0.0–0.4)
Eos: 3 %
Hematocrit: 27.7 % — ABNORMAL LOW (ref 37.5–51.0)
Hemoglobin: 9 g/dL — ABNORMAL LOW (ref 13.0–17.7)
IMMATURE GRANS (ABS): 0 10*3/uL (ref 0.0–0.1)
Immature Granulocytes: 0 %
LYMPHS ABS: 2.5 10*3/uL (ref 0.7–3.1)
LYMPHS: 27 %
MCH: 29.2 pg (ref 26.6–33.0)
MCHC: 32.5 g/dL (ref 31.5–35.7)
MCV: 90 fL (ref 79–97)
MONOCYTES: 11 %
Monocytes Absolute: 1 10*3/uL — ABNORMAL HIGH (ref 0.1–0.9)
NEUTROS ABS: 5.5 10*3/uL (ref 1.4–7.0)
NRBC: 3 % — AB (ref 0–0)
Neutrophils: 58 %
PLATELETS: 794 10*3/uL — AB (ref 150–379)
RBC: 3.08 x10E6/uL — AB (ref 4.14–5.80)
RDW: 15.9 % — AB (ref 12.3–15.4)
WBC: 9.3 10*3/uL (ref 3.4–10.8)

## 2017-10-10 ENCOUNTER — Ambulatory Visit: Payer: BLUE CROSS/BLUE SHIELD | Admitting: Nurse Practitioner

## 2017-10-12 ENCOUNTER — Ambulatory Visit (INDEPENDENT_AMBULATORY_CARE_PROVIDER_SITE_OTHER): Payer: BLUE CROSS/BLUE SHIELD | Admitting: Pulmonary Disease

## 2017-10-12 ENCOUNTER — Encounter: Payer: Self-pay | Admitting: Pulmonary Disease

## 2017-10-12 VITALS — BP 124/80 | HR 83 | Ht 72.0 in | Wt 306.6 lb

## 2017-10-12 DIAGNOSIS — J351 Hypertrophy of tonsils: Secondary | ICD-10-CM

## 2017-10-12 DIAGNOSIS — G4733 Obstructive sleep apnea (adult) (pediatric): Secondary | ICD-10-CM

## 2017-10-12 DIAGNOSIS — Z9989 Dependence on other enabling machines and devices: Secondary | ICD-10-CM | POA: Diagnosis not present

## 2017-10-12 DIAGNOSIS — Z6841 Body Mass Index (BMI) 40.0 and over, adult: Secondary | ICD-10-CM | POA: Diagnosis not present

## 2017-10-12 DIAGNOSIS — Z789 Other specified health status: Secondary | ICD-10-CM

## 2017-10-12 NOTE — Patient Instructions (Signed)
Can look up CPAP mask options online.  A good website is [email or web address].  Will show you how to create a MyChart account  Follow up in 1 year

## 2017-10-12 NOTE — Progress Notes (Signed)
Current Outpatient Prescriptions on File Prior to Visit  Medication Sig  . ferrous sulfate 325 (65 FE) MG EC tablet Take 1 tablet (325 mg total) by mouth 2 (two) times daily.  . pantoprazole (PROTONIX) 40 MG tablet Take 1 tablet (40 mg total) by mouth 2 (two) times daily before a meal.   No current facility-administered medications on file prior to visit.      Chief Complaint  Patient presents with  . Follow-up    Pt is having hard time with Mask fitting and use, would like to have different option. Pt is not enrolled to airview, will call [facility] today for enroll.     Sleep tests HST 10/16/16 >> AHI 93.3, SaO2 low 66% Auto CPAP 09/12/17 to 10/11/17 >> used on 26 of 30 nights with average 6 hrs 35 min.  Average AHI 5.8 with median CPAP 13 and 95 th percentile CPAP 14 cm H2O.  Mask leak noted.  Cardiac tests Echo 09/29/17 >> EF 60 to 65%, mod AS, severe LA dilation  Past medical history Duodenal ulcer, Aortic stenosis, cholelithiasis  Past surgical history, Family history, Social history, Allergies all reviewed.  Vital Signs BP 124/80 (BP Location: Left Arm, Cuff Size: Normal)   Pulse 83   Ht 6' (1.829 m)   Wt (!) 306 lb 9.6 oz (139.1 kg)   SpO2 98%   BMI 41.58 kg/m   History of Present Illness Navdeep Halt Postema is a 41 y.o. male with obstructive sleep apnea.  He was originally seen by Dr. Corrie Dandy.  He had sleep study in October 2017 which showed severe sleep apnea.  He was started on auto CPAP.  He did well initially.  He feels the pressure is okay.  He has trouble with his mask fit.  He has full face mask.  The mask keeps shifting and he gets mask leak.  This is his main issue with using CPAP.  Otherwise he feels like using CPAP helps.  He gets about 7 hrs sleep per night.  He gets occasional sinus infections, but doesn't usually have trouble with allergies.  He was in hospital recently for duodenal ulcer.  His H pylori was negative.  Physical Exam  General - pleasant Eyes -  pupils reactive ENT - no sinus tenderness, no oral exudate, no LAN, MP 3, 4+ tonsils, elongated uvula Cardiac - regular, 2/6 murmur Chest - no wheeze, rales Abd - soft, non tender Ext - no edema Skin - no rashes Neuro - normal strength Psych - normal mood   CMP Latest Ref Rng & Units 10/05/2017 09/29/2017 09/28/2017  Glucose 65 - 99 mg/dL 84 118(H) 128(H)  BUN 6 - 24 mg/dL 13 30(H) 50(H)  Creatinine 0.76 - 1.27 mg/dL 0.93 0.94 1.07  Sodium 134 - 144 mmol/L 140 139 137  Potassium 3.5 - 5.2 mmol/L 4.9 4.0 4.0  Chloride 96 - 106 mmol/L 104 110 108  CO2 20 - 29 mmol/L 20 22 23   Calcium 8.7 - 10.2 mg/dL 8.7 7.4(L) 8.1(L)  Total Protein 6.0 - 8.5 g/dL 6.3 5.4(L) 6.5  Total Bilirubin 0.0 - 1.2 mg/dL 0.4 1.0 0.6  Alkaline Phos 39 - 117 IU/L 60 45 44  AST 0 - 40 IU/L 22 27 24   ALT 0 - 44 IU/L 22 21 27     CBC Latest Ref Rng & Units 10/05/2017 10/01/2017 09/30/2017  WBC 3.4 - 10.8 x10E3/uL 9.3 11.8(H) 13.2(H)  Hemoglobin 13.0 - 17.7 g/dL 9.0(L) 8.4(L) 8.1(L)  Hematocrit 37.5 -  51.0 % 27.7(L) 24.9(L) 24.7(L)  Platelets 150 - 379 x10E3/uL 794(H) 383 324     Assessment/Plan  Obstructive sleep apnea. - he is compliant with CPAP and reports benefit - he has trouble with full face mask - advised him to look up CPAP mask options online and email if he finds a mask he would like to switch to - continue auto CPAP  Tonsillar hypertrophy. - if he continues to have trouble using CPAP, then might consider ENT referral - explained that surgical intervention might not fix his sleep apnea, but intent would be to make therapy more manageable  Obesity. - reviewed how his weight is impacting his sleep apnea - discussed options to assist with weight loss   Patient Instructions  Can look up CPAP mask options online.  A good website is [email or web address].  Will show you how to create a MyChart account  Follow up in 1 year  Time spent 28 minutes  Chesley Mires, MD [facility]  [facility]/Sleep Pager:  [phone] 10/12/2017, 9:40 AM

## 2017-10-15 ENCOUNTER — Ambulatory Visit (INDEPENDENT_AMBULATORY_CARE_PROVIDER_SITE_OTHER): Payer: BLUE CROSS/BLUE SHIELD | Admitting: Family Medicine

## 2017-10-15 ENCOUNTER — Encounter: Payer: Self-pay | Admitting: Family Medicine

## 2017-10-15 VITALS — BP 126/77 | HR 83 | Temp 97.5°F | Ht 73.0 in | Wt 306.6 lb

## 2017-10-15 DIAGNOSIS — K922 Gastrointestinal hemorrhage, unspecified: Secondary | ICD-10-CM | POA: Diagnosis not present

## 2017-10-15 DIAGNOSIS — Q8901 Asplenia (congenital): Secondary | ICD-10-CM | POA: Diagnosis not present

## 2017-10-15 DIAGNOSIS — Z23 Encounter for immunization: Secondary | ICD-10-CM

## 2017-10-15 DIAGNOSIS — D75839 Thrombocytosis, unspecified: Secondary | ICD-10-CM

## 2017-10-15 DIAGNOSIS — D473 Essential (hemorrhagic) thrombocythemia: Secondary | ICD-10-CM

## 2017-10-15 NOTE — Patient Instructions (Signed)
Great top see you!
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
## 2017-10-16 ENCOUNTER — Other Ambulatory Visit (HOSPITAL_COMMUNITY): Payer: BLUE CROSS/BLUE SHIELD

## 2017-11-12 ENCOUNTER — Ambulatory Visit (HOSPITAL_BASED_OUTPATIENT_CLINIC_OR_DEPARTMENT_OTHER): Payer: BLUE CROSS/BLUE SHIELD | Admitting: Hematology & Oncology

## 2017-11-12 ENCOUNTER — Other Ambulatory Visit (HOSPITAL_BASED_OUTPATIENT_CLINIC_OR_DEPARTMENT_OTHER): Payer: BLUE CROSS/BLUE SHIELD

## 2017-11-12 ENCOUNTER — Other Ambulatory Visit: Payer: Self-pay

## 2017-11-12 VITALS — BP 134/87 | HR 78 | Temp 98.5°F | Resp 20 | Wt 300.0 lb

## 2017-11-12 DIAGNOSIS — D473 Essential (hemorrhagic) thrombocythemia: Secondary | ICD-10-CM

## 2017-11-12 DIAGNOSIS — Z9081 Acquired absence of spleen: Secondary | ICD-10-CM

## 2017-11-12 DIAGNOSIS — D509 Iron deficiency anemia, unspecified: Secondary | ICD-10-CM | POA: Diagnosis not present

## 2017-11-12 DIAGNOSIS — D500 Iron deficiency anemia secondary to blood loss (chronic): Secondary | ICD-10-CM

## 2017-11-12 DIAGNOSIS — K922 Gastrointestinal hemorrhage, unspecified: Secondary | ICD-10-CM | POA: Diagnosis not present

## 2017-11-12 DIAGNOSIS — D539 Nutritional anemia, unspecified: Secondary | ICD-10-CM

## 2017-11-12 LAB — CMP (CANCER CENTER ONLY)
ALK PHOS: 64 U/L (ref 26–84)
ALT: 32 U/L (ref 10–47)
AST: 30 U/L (ref 11–38)
Albumin: 4 g/dL (ref 3.3–5.5)
BILIRUBIN TOTAL: 0.7 mg/dL (ref 0.20–1.60)
BUN: 10 mg/dL (ref 7–22)
CALCIUM: 9.2 mg/dL (ref 8.0–10.3)
CO2: 26 meq/L (ref 18–33)
Chloride: 104 mEq/L (ref 98–108)
Creat: 0.9 mg/dl (ref 0.6–1.2)
GLUCOSE: 112 mg/dL (ref 73–118)
POTASSIUM: 3.6 meq/L (ref 3.3–4.7)
Sodium: 142 mEq/L (ref 128–145)
Total Protein: 7.3 g/dL (ref 6.4–8.1)

## 2017-11-12 LAB — CBC WITH DIFFERENTIAL (CANCER CENTER ONLY)
HEMATOCRIT: 34.3 % — AB (ref 38.7–49.9)
HGB: 10.5 g/dL — ABNORMAL LOW (ref 13.0–17.1)
MCH: 23 pg — ABNORMAL LOW (ref 28.0–33.4)
MCHC: 30.6 g/dL — AB (ref 32.0–35.9)
MCV: 75 fL — ABNORMAL LOW (ref 82–98)
PLATELETS: 707 10*3/uL — AB (ref 145–400)
RBC: 4.57 10*6/uL (ref 4.20–5.70)
RDW: 19.3 % — AB (ref 11.1–15.7)
WBC: 10.2 10*3/uL — AB (ref 4.0–10.0)

## 2017-11-12 LAB — MANUAL DIFFERENTIAL (CHCC SATELLITE)
ALC: 3.4 10*3/uL — ABNORMAL HIGH (ref 0.9–3.3)
ANC (CHCC HP manual diff): 5.9 10*3/uL (ref 1.5–6.5)
BASO: 1 % (ref 0–2)
Eos: 2 % (ref 0–7)
LYMPH: 33 % (ref 14–48)
MONO: 6 % (ref 0–13)
PLT EST ~~LOC~~: INCREASED
SEG: 58 % (ref 40–75)

## 2017-11-12 LAB — CHCC SATELLITE - SMEAR

## 2017-11-12 MED ORDER — PANTOPRAZOLE SODIUM 40 MG PO TBEC: 40.0000 mg | DELAYED_RELEASE_TABLET | Freq: Two times a day (BID) | ORAL | 12 refills | Status: DC
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
## 2017-11-13 LAB — RETICULOCYTES: RETICULOCYTE COUNT: 1.4 % (ref 0.6–2.6)

## 2017-11-13 LAB — IRON AND TIBC
%SAT: 3 % — ABNORMAL LOW (ref 20–55)
IRON: 15 ug/dL — AB (ref 42–163)
TIBC: 458 ug/dL — AB (ref 202–409)
UIBC: 442 ug/dL — AB (ref 117–376)

## 2017-11-13 LAB — FERRITIN: FERRITIN: 9 ng/mL — AB (ref 22–316)

## 2017-11-13 LAB — ERYTHROPOIETIN: ERYTHROPOIETIN: 66.8 m[IU]/mL — AB (ref 2.6–18.5)

## 2017-11-14 ENCOUNTER — Encounter: Payer: Self-pay | Admitting: Hematology & Oncology

## 2017-11-14 DIAGNOSIS — D500 Iron deficiency anemia secondary to blood loss (chronic): Secondary | ICD-10-CM

## 2017-11-14 HISTORY — DX: Iron deficiency anemia secondary to blood loss (chronic): D50.0

## 2017-11-14 NOTE — Addendum Note (Signed)
Addended by: Burney Gauze R on: 11/14/2017 07:19 AM   Modules accepted: Orders

## 2017-11-22 ENCOUNTER — Telehealth: Payer: Self-pay | Admitting: *Deleted

## 2017-11-22 NOTE — Telephone Encounter (Addendum)
Patient is aware. Insurance hasn't approved treatment yet. Will schedule infusions when insurance approval received.   ----- Message from Volanda Napoleon, MD sent at 11/14/2017  7:07 AM EST ----- Call - as I expected, your iron is still VERY low!!  You need 2 doses of IV iron!!!  Please set this up!!!  Laurey Arrow

## 2017-12-19 DIAGNOSIS — K8020 Calculus of gallbladder without cholecystitis without obstruction: Secondary | ICD-10-CM | POA: Insufficient documentation

## 2017-12-19 DIAGNOSIS — G4730 Sleep apnea, unspecified: Secondary | ICD-10-CM | POA: Insufficient documentation

## 2017-12-19 DIAGNOSIS — T7840XA Allergy, unspecified, initial encounter: Secondary | ICD-10-CM | POA: Insufficient documentation

## 2017-12-19 DIAGNOSIS — G4733 Obstructive sleep apnea (adult) (pediatric): Secondary | ICD-10-CM | POA: Insufficient documentation

## 2017-12-19 DIAGNOSIS — Z5189 Encounter for other specified aftercare: Secondary | ICD-10-CM | POA: Insufficient documentation

## 2017-12-20 ENCOUNTER — Ambulatory Visit (HOSPITAL_COMMUNITY): Payer: BLUE CROSS/BLUE SHIELD

## 2017-12-26 ENCOUNTER — Other Ambulatory Visit (HOSPITAL_COMMUNITY): Payer: BLUE CROSS/BLUE SHIELD

## 2017-12-26 ENCOUNTER — Ambulatory Visit (HOSPITAL_COMMUNITY): Payer: BLUE CROSS/BLUE SHIELD | Attending: Cardiovascular Disease

## 2017-12-26 ENCOUNTER — Other Ambulatory Visit: Payer: Self-pay

## 2017-12-26 DIAGNOSIS — E669 Obesity, unspecified: Secondary | ICD-10-CM | POA: Diagnosis not present

## 2017-12-26 DIAGNOSIS — Q230 Congenital stenosis of aortic valve: Secondary | ICD-10-CM | POA: Insufficient documentation

## 2017-12-26 DIAGNOSIS — G4730 Sleep apnea, unspecified: Secondary | ICD-10-CM | POA: Insufficient documentation

## 2017-12-26 DIAGNOSIS — Q231 Congenital insufficiency of aortic valve: Secondary | ICD-10-CM | POA: Diagnosis not present

## 2017-12-28 ENCOUNTER — Telehealth: Payer: Self-pay | Admitting: Hematology & Oncology

## 2017-12-28 NOTE — Telephone Encounter (Signed)
Enrollment Success! [place] Under the [employer], qualifying patients always pay the first $50 out of pocket for Injectafer for the first dose and pay as little as $0 out of pocket for the second dose. Qualifying patients can receive up to $500 in assistance towards their out of pocket costs for each dose of Injectafer. A single enrollment in the program covers up to two doses, or a maximum of $1000. For further courses of treatment, re-enrollment is required. One re-enrollment is allowed per 15-month period. Confirmation Number: [id number] Patient Name: Brian Kerr Patient ID: [id number] Enrollment Expiration Date: 12/27/2018 Card Number: [id number] [place] Date (MM/YY): 11/23 Please keep the above information for your records.  Remember, this program provides assistance on your prescription co-payment only. It cannot be used to satisfy any other treatment expense. Please note, you are still responsible for any additional co-payment beyond the maximum benefit per dose provided by this program, depending on your insurance benefit.   Physicians, office personnel, or patients will be required to fax or upload via the website supporting documentation, typically in the form of an Explanation of Benefits (EOB) after each dose. The toll free fax number is [phone]. After the EOB is reviewed and approved, the appropriate funds will be loaded to the virtual debit card within 2 business days. If you require more information about your virtual debit card, please call this toll-free number: [phone] (Monday - Friday, 9:00 am to 5:00 pm ET).   If your physician is unable to process your card, please call [phone]. (9:00 AM ET to 5:00 PM ET, Monday through Friday, except holidays)   All documentation can also be mailed to: [address] [place], [place] [address].  IMPORTANT SAFETY INFORMATION   INDICATIONS  Injectafer (ferric  carboxymaltose injection) is an iron replacement product indicated for the treatment of iron deficiency anemia (IDA) in adult patients who have intolerance to oral iron or have had unsatisfactory response to oral iron, and in adult patients with non-dialysis dependent chronic kidney disease.  IMPORTANT SAFETY INFORMATION  CONTRAINDICATIONS  Injectafer is contraindicated in patients with hypersensitivity to [facility] or any of its inactive components.  WARNINGS AND PRECAUTIONS  Serious hypersensitivity reactions, including anaphylactic-type reactions, some of which have been life-threatening and fatal, have been reported in patients receiving Injectafer. Patients may present with shock, clinically significant hypotension, loss of consciousness, and/or collapse. Monitor patients for signs and symptoms of hypersensitivity during and after Injectafer administration for at least 30 minutes and until clinically stable following completion of the infusion. Only administer Injectafer when personnel and therapies are immediately available for the treatment of serious hypersensitivity reactions. In clinical trials, serious anaphylactic/anaphylactoid reactions were reported in 0.1% (01/1774) of subjects receiving Injectafer. Other serious or severe adverse reactions potentially associated with hypersensitivity which included, but were not limited to, pruritus, rash, urticaria, wheezing, or hypotension were reported in 1.5% (26/1775) of these subjects.  In clinical studies, hypertension was reported in 3.8% (67/1775) of subjects. Transient elevations in systolic blood pressure, sometimes occurring with facial flushing, dizziness, or nausea were observed in 6% (106/1775) of subjects. These elevations generally occurred immediately after dosing and resolved within 30 minutes. Monitor patients for signs and symptoms of hypertension following each Injectafer administration.  In the 24 hours following administration of  Injectafer, laboratory assays may overestimate serum iron and transferrin bound iron by also measuring the iron in Injectafer.  ADVERSE REACTIONS  In two randomized clinical studies, a total  of 1775 patients were exposed to Injectafer, 15 mg/kg of body weight, up to a single maximum dose of 750 mg of iron on two occasions, separated by at least 7 days, up to a cumulative dose of 1500 mg of iron. Adverse reactions reported by ?2% of Injectafer-treated patients were nausea (7.2%); hypertension (3.8%); flushing/hot flush (3.6%); blood phosphorus decrease (2.1%); and dizziness (2.0%). The following serious adverse reactions have been most commonly reported from the post-marketing spontaneous reports: urticaria, dyspnea, pruritus, tachycardia, erythema, pyrexia, chest discomfort, chills, angioedema, back pain, arthralgia, and syncope.  Please see Full Prescribing Information.

## 2018-01-09 ENCOUNTER — Encounter (INDEPENDENT_AMBULATORY_CARE_PROVIDER_SITE_OTHER): Payer: Self-pay

## 2018-01-09 ENCOUNTER — Encounter: Payer: Self-pay | Admitting: Interventional Cardiology

## 2018-01-09 ENCOUNTER — Ambulatory Visit: Payer: BLUE CROSS/BLUE SHIELD | Admitting: Interventional Cardiology

## 2018-01-09 VITALS — BP 146/90 | HR 88 | Ht 72.0 in | Wt 306.0 lb

## 2018-01-09 DIAGNOSIS — G4733 Obstructive sleep apnea (adult) (pediatric): Secondary | ICD-10-CM

## 2018-01-09 DIAGNOSIS — I350 Nonrheumatic aortic (valve) stenosis: Secondary | ICD-10-CM

## 2018-01-09 DIAGNOSIS — K274 Chronic or unspecified peptic ulcer, site unspecified, with hemorrhage: Secondary | ICD-10-CM | POA: Diagnosis not present

## 2018-01-09 DIAGNOSIS — E785 Hyperlipidemia, unspecified: Secondary | ICD-10-CM | POA: Diagnosis not present

## 2018-01-09 DIAGNOSIS — R030 Elevated blood-pressure reading, without diagnosis of hypertension: Secondary | ICD-10-CM

## 2018-01-09 NOTE — Patient Instructions (Signed)
Medication Instructions:  Your physician recommends that you continue on your current medications as directed. Please refer to the Current Medication list given to you today.  Labwork: None  Testing/Procedures: Your physician has requested that you have an echocardiogram in January 2020 prior to seeing Dr. Tamala Julian.. Echocardiography is a painless test that uses sound waves to create images of your heart. It provides your doctor with information about the size and shape of your heart and how well your heart's chambers and valves are working. This procedure takes approximately one hour. There are no restrictions for this procedure.    Follow-Up: Your physician recommends that you schedule a follow-up appointment in: 6-8 weeks with a PA or NP.  Your physician wants you to follow-up in: 1 year with Dr. Tamala Julian.  You will receive a reminder letter in the mail two months in advance. If you don't receive a letter, please call our office to schedule the follow-up appointment.    Any Other Special Instructions Will Be Listed Below (If Applicable).     If you need a refill on your cardiac medications before your next appointment, please call your pharmacy.
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
## 2018-02-21 ENCOUNTER — Ambulatory Visit: Payer: BLUE CROSS/BLUE SHIELD | Admitting: Cardiology

## 2018-03-04 ENCOUNTER — Encounter: Payer: Self-pay | Admitting: Family Medicine

## 2018-03-04 ENCOUNTER — Ambulatory Visit: Payer: BLUE CROSS/BLUE SHIELD | Admitting: Family Medicine

## 2018-03-04 VITALS — BP 143/96 | HR 89 | Temp 98.0°F | Ht 72.0 in | Wt 305.4 lb

## 2018-03-04 DIAGNOSIS — J0100 Acute maxillary sinusitis, unspecified: Secondary | ICD-10-CM | POA: Diagnosis not present

## 2018-03-04 MED ORDER — AMOXICILLIN-POT CLAVULANATE 875-125 MG PO TABS: 1.0000 | ORAL_TABLET | Freq: Two times a day (BID) | ORAL | 0 refills | Status: DC

## 2018-03-04 NOTE — Progress Notes (Signed)
   HPI  Patient presents today here with concern for sinus infection.  Patient describes 7-10 days of nasal congestion, cough, headache, sore throat, facial pressure and bilateral maxillary area.  Patient states over the last few days he is developed mild shortness of breath.  He denies fever, chills, sweats.  He is tolerating food and fluids like usual.  PMH: Smoking status noted ROS: Per HPI  Objective: BP (!) 143/96   Pulse 89   Temp 98 F (36.7 C) (Oral)   Ht 6' (1.829 m)   Wt (!) 305 lb 6.4 oz (138.5 kg)   SpO2 95%   BMI 41.42 kg/m  Gen: NAD, alert, cooperative with exam HEENT: NCAT, pharynx moist and clear, TMs normal bilaterally, tenderness to palpation of bilateral maxillary sinuses CV: RRR, good S1/S2, no murmur Resp: Good air movement, nonlabored, added coarse breath sounds in the left upper lung field Ext: No edema, warm Neuro: Alert and oriented, No gross deficits  Assessment and plan:  #Acute maxillary sinusitis Treat with Augmentin Discussed supportive care Return to clinic with any concerns     Meds ordered this encounter  Medications  . amoxicillin-clavulanate (AUGMENTIN) 875-125 MG tablet    Sig: Take 1 tablet by mouth 2 (two) times daily.    Dispense:  20 tablet    Refill:  0    Laroy Apple, MD [facility] 03/04/2018, 8:59 AM

## 2018-03-04 NOTE — Patient Instructions (Signed)
Great to see you!  Consider Dr. Lajuana Ripple or Dr. Warrick Parisian   Sinusitis, Adult Sinusitis is soreness and inflammation of your sinuses. Sinuses are hollow spaces in the bones around your face. They are located:  Around your eyes.  In the middle of your forehead.  Behind your nose.  In your cheekbones.  Your sinuses and nasal passages are lined with a stringy fluid (mucus). Mucus normally drains out of your sinuses. When your nasal tissues get inflamed or swollen, the mucus can get trapped or blocked so air cannot flow through your sinuses. This lets bacteria, viruses, and funguses grow, and that leads to infection. Follow these instructions at home: Medicines  Take, use, or apply over-the-counter and prescription medicines only as told by your doctor. These may include nasal sprays.  If you were prescribed an antibiotic medicine, take it as told by your doctor. Do not stop taking the antibiotic even if you start to feel better. Hydrate and Humidify  Drink enough water to keep your pee (urine) clear or pale yellow.  Use a cool mist humidifier to keep the humidity level in your home above 50%.  Breathe in steam for 10-15 minutes, 3-4 times a day or as told by your doctor. You can do this in the bathroom while a hot shower is running.  Try not to spend time in cool or dry air. Rest  Rest as much as possible.  Sleep with your head raised (elevated).  Make sure to get enough sleep each night. General instructions  Put a warm, moist washcloth on your face 3-4 times a day or as told by your doctor. This will help with discomfort.  Wash your hands often with soap and water. If there is no soap and water, use hand sanitizer.  Do not smoke. Avoid being around people who are smoking (secondhand smoke).  Keep all follow-up visits as told by your doctor. This is important. Contact a doctor if:  You have a fever.  Your symptoms get worse.  Your symptoms do not get better within  10 days. Get help right away if:  You have a very bad headache.  You cannot stop throwing up (vomiting).  You have pain or swelling around your face or eyes.  You have trouble seeing.  You feel confused.  Your neck is stiff.  You have trouble breathing. This information is not intended to replace advice given to you by your health care provider. Make sure you discuss any questions you have with your health care provider. Document Released: 05/29/2008 Document Revised: 08/06/2016 Document Reviewed: 10/06/2015 Elsevier Interactive Patient Education  [employer].

## 2018-03-06 ENCOUNTER — Telehealth: Payer: Self-pay | Admitting: Family Medicine

## 2018-06-28 ENCOUNTER — Telehealth: Payer: Self-pay | Admitting: Interventional Cardiology

## 2018-06-28 NOTE — Telephone Encounter (Signed)
Called patient's wife Maudie Mercury back about her questions. Patient had a tooth extracted that was painful and invasive, per wife. Dental office gave patient antibiotics and pain medications. Informed patient's wife that it sounds like dental office has got the patient covered with the right medications. Informed patient's wife if patient is ever having a dental procedure to have the dental office send a clearance request before hand.  Patient's wife verbalized understanding.

## 2018-06-28 NOTE — Telephone Encounter (Signed)
New message    Patient's spouse calling, patient had dental work done today. Patient started on antibiotic and pain medication. Spouse has questions regarding aortic valve stenosis in regards to medications

## 2018-12-20 ENCOUNTER — Encounter (INDEPENDENT_AMBULATORY_CARE_PROVIDER_SITE_OTHER): Payer: Self-pay

## 2018-12-20 ENCOUNTER — Other Ambulatory Visit: Payer: Self-pay

## 2018-12-20 ENCOUNTER — Ambulatory Visit (HOSPITAL_COMMUNITY): Payer: BLUE CROSS/BLUE SHIELD | Attending: Cardiovascular Disease

## 2018-12-20 DIAGNOSIS — I350 Nonrheumatic aortic (valve) stenosis: Secondary | ICD-10-CM | POA: Insufficient documentation

## 2019-01-24 ENCOUNTER — Encounter: Payer: Self-pay | Admitting: *Deleted

## 2019-02-20 ENCOUNTER — Encounter: Payer: Self-pay | Admitting: Family Medicine

## 2019-02-20 ENCOUNTER — Ambulatory Visit: Payer: BLUE CROSS/BLUE SHIELD | Admitting: Family Medicine

## 2019-02-20 ENCOUNTER — Other Ambulatory Visit: Payer: Self-pay

## 2019-02-20 VITALS — BP 148/84 | HR 97 | Temp 98.2°F | Ht 72.0 in | Wt 316.0 lb

## 2019-02-20 DIAGNOSIS — J0100 Acute maxillary sinusitis, unspecified: Secondary | ICD-10-CM | POA: Diagnosis not present

## 2019-02-20 MED ORDER — FLUTICASONE PROPIONATE 50 MCG/ACT NA SUSP: 2.0000 | Freq: Every day | NASAL | 6 refills | Status: DC

## 2019-02-20 MED ORDER — AMOXICILLIN-POT CLAVULANATE 875-125 MG PO TABS: 1.0000 | ORAL_TABLET | Freq: Two times a day (BID) | ORAL | 0 refills | Status: AC

## 2019-02-20 NOTE — Patient Instructions (Signed)
Sinusitis, Adult  Sinusitis is inflammation of your sinuses. Sinuses are hollow spaces in the bones around your face. Your sinuses are located:   Around your eyes.   In the middle of your forehead.   Behind your nose.   In your cheekbones.  Mucus normally drains out of your sinuses. When your nasal tissues become inflamed or swollen, mucus can become trapped or blocked. This allows bacteria, viruses, and fungi to grow, which leads to infection. Most infections of the sinuses are caused by a virus.  Sinusitis can develop quickly. It can last for up to 4 weeks (acute) or for more than 12 weeks (chronic). Sinusitis often develops after a cold.  What are the causes?  This condition is caused by anything that creates swelling in the sinuses or stops mucus from draining. This includes:   Allergies.   Asthma.   Infection from bacteria or viruses.   Deformities or blockages in your nose or sinuses.   Abnormal growths in the nose (nasal polyps).   Pollutants, such as chemicals or irritants in the air.   Infection from fungi (rare).  What increases the risk?  You are more likely to develop this condition if you:   Have a weak body defense system (immune system).   Do a lot of swimming or diving.   Overuse nasal sprays.   Smoke.  What are the signs or symptoms?  The main symptoms of this condition are pain and a feeling of pressure around the affected sinuses. Other symptoms include:   Stuffy nose or congestion.   Thick drainage from your nose.   Swelling and warmth over the affected sinuses.   Headache.   Upper toothache.   A cough that may get worse at night.   Extra mucus that collects in the throat or the back of the nose (postnasal drip).   Decreased sense of smell and taste.   Fatigue.   A fever.   Sore throat.   Bad breath.  How is this diagnosed?  This condition is diagnosed based on:   Your symptoms.   Your medical history.   A physical exam.   Tests to find out if your condition is  acute or chronic. This may include:  ? Checking your nose for nasal polyps.  ? Viewing your sinuses using a device that has a light (endoscope).  ? Testing for allergies or bacteria.  ? Imaging tests, such as an MRI or CT scan.  In rare cases, a bone biopsy may be done to rule out more serious types of fungal sinus disease.  How is this treated?  Treatment for sinusitis depends on the cause and whether your condition is chronic or acute.   If caused by a virus, your symptoms should go away on their own within 10 days. You may be given medicines to relieve symptoms. They include:  ? Medicines that shrink swollen nasal passages (topical intranasal decongestants).  ? Medicines that treat allergies (antihistamines).  ? A spray that eases inflammation of the nostrils (topical intranasal corticosteroids).  ? Rinses that help get rid of thick mucus in your nose (nasal saline washes).   If caused by bacteria, your health care provider may recommend waiting to see if your symptoms improve. Most bacterial infections will get better without antibiotic medicine. You may be given antibiotics if you have:  ? A severe infection.  ? A weak immune system.   If caused by narrow nasal passages or nasal polyps, you may need   to have surgery.  Follow these instructions at home:  Medicines   Take, use, or apply over-the-counter and prescription medicines only as told by your health care provider. These may include nasal sprays.   If you were prescribed an antibiotic medicine, take it as told by your health care provider. Do not stop taking the antibiotic even if you start to feel better.  Hydrate and humidify     Drink enough fluid to keep your urine pale yellow. Staying hydrated will help to thin your mucus.   Use a cool mist humidifier to keep the humidity level in your home above 50%.   Inhale steam for 10-15 minutes, 3-4 times a day, or as told by your health care provider. You can do this in the bathroom while a hot shower is  running.   Limit your exposure to cool or dry air.  Rest   Rest as much as possible.   Sleep with your head raised (elevated).   Make sure you get enough sleep each night.  General instructions     Apply a warm, moist washcloth to your face 3-4 times a day or as told by your health care provider. This will help with discomfort.   Wash your hands often with soap and water to reduce your exposure to germs. If soap and water are not available, use hand sanitizer.   Do not smoke. Avoid being around people who are smoking (secondhand smoke).   Keep all follow-up visits as told by your health care provider. This is important.  Contact a health care provider if:   You have a fever.   Your symptoms get worse.   Your symptoms do not improve within 10 days.  Get help right away if:   You have a severe headache.   You have persistent vomiting.   You have severe pain or swelling around your face or eyes.   You have vision problems.   You develop confusion.   Your neck is stiff.   You have trouble breathing.  Summary   Sinusitis is soreness and inflammation of your sinuses. Sinuses are hollow spaces in the bones around your face.   This condition is caused by nasal tissues that become inflamed or swollen. The swelling traps or blocks the flow of mucus. This allows bacteria, viruses, and fungi to grow, which leads to infection.   If you were prescribed an antibiotic medicine, take it as told by your health care provider. Do not stop taking the antibiotic even if you start to feel better.   Keep all follow-up visits as told by your health care provider. This is important.  This information is not intended to replace advice given to you by your health care provider. Make sure you discuss any questions you have with your health care provider.  Document Released: 12/11/2005 Document Revised: 05/13/2018 Document Reviewed: 05/13/2018  Elsevier Interactive Patient Education  2019 Elsevier Inc.

## 2019-02-20 NOTE — Progress Notes (Addendum)
    Subjective:     Brian Kerr is a 43 y.o. male who presents for evaluation of symptoms of a URI, possible sinusitis. Symptoms include bilateral ear pressure/pain, congestion, coryza, facial pain, headache described as pressure, lightheadedness, low grade fever, nasal congestion, post nasal drip, productive cough with  yellow colored sputum, sinus pressure and sore throat. Onset of symptoms was 3 days ago, and has been gradually worsening since that time. Treatment to date: decongestants.  The following portions of the patient's history were reviewed and updated as appropriate: allergies, current medications, past family history, past medical history, past social history, past surgical history and problem list.  Review of Systems Pertinent items are noted in HPI.   Objective:    BP (!) 148/84   Pulse 97   Temp 98.2 F (36.8 C) (Oral)   Ht 6' (1.829 m)   Wt (!) 316 lb (143.3 kg)   BMI 42.86 kg/m  General appearance: alert, cooperative and mild distress Head: Normocephalic, without obvious abnormality, atraumatic Eyes: negative Ears: normal TM and external ear canal left ear and abnormal TM right ear - erythematous Nose: yellow discharge, moderate congestion, turbinates red, swollen, moderate maxillary sinus tenderness bilateral, no frontal sinus tenderness bilateral Throat: abnormal findings: moderate oropharyngeal erythema Neck: mild anterior cervical adenopathy, no carotid bruit, no JVD, supple, symmetrical, trachea midline and thyroid not enlarged, symmetric, no tenderness/mass/nodules Lungs: clear to auscultation bilaterally Heart: systolic murmur: harsh systolic 3/6, medium pitch at 2nd right and left intercostal space Skin: Skin color, texture, turgor normal. No rashes or lesions Neurologic: Grossly normal   Assessment:   Brian Kerr was seen today for uri.  Diagnoses and all orders for this visit:  Acute non-recurrent maxillary sinusitis Symptomatic care discussed.  Medications as prescribed. Report any new or worsening symptoms.  -     amoxicillin-clavulanate (AUGMENTIN) 875-125 MG tablet; Take 1 tablet by mouth 2 (two) times daily for 10 days. -     fluticasone (FLONASE) 50 MCG/ACT nasal spray; Place 2 sprays into both nostrils daily.    Plan:    Discussed diagnosis and treatment of URI. Discussed the diagnosis and treatment of sinusitis. Suggested symptomatic OTC remedies. Nasal saline spray for congestion. Augmentin per orders. Nasal steroids per orders. Follow up as needed. Call in 3 days if symptoms aren't resolving.   Return if symptoms worsen or fail to improve.  The above assessment and management plan was discussed with the patient. The patient verbalized understanding of and has agreed to the management plan. Patient is aware to call the clinic if symptoms fail to improve or worsen. Patient is aware when to return to the clinic for a follow-up visit. Patient educated on when it is appropriate to go to the emergency department.   Monia Pouch, FNP-C [facility] [address] [place], [place] [address] [phone]

## 2019-04-25 ENCOUNTER — Telehealth: Payer: Self-pay | Admitting: Physician Assistant

## 2019-04-25 NOTE — Telephone Encounter (Signed)
Spoke with patient who confirmed all demographics. Patient has a smart phone. I sent a text link for My Chart sign up; which he said that he would do when he gets home from work. Will have vitals ready for visit.

## 2019-04-25 NOTE — Telephone Encounter (Signed)
Virtual Visit Pre-Appointment Phone Call  "(Name), I am calling you today to discuss your upcoming appointment. We are currently trying to limit exposure to the virus that causes COVID-19 by seeing patients at home rather than in the office."  1. "What is the BEST phone number to call the day of the visit?" - include this in appointment notes  2. Do you have or have access to (through a family member/friend) a smartphone with video capability that we can use for your visit?" a. If yes - list this number in appt notes as cell (if different from BEST phone #) and list the appointment type as a VIDEO visit in appointment notes b. If no - list the appointment type as a PHONE visit in appointment notes  Confirm consent - "In the setting of the current Covid19 crisis, you are scheduled for a (phone or video) visit with your provider on (date) at (time).  Just as we do with many in-office visits, in order for you to participate in this visit, we must obtain consent.  If you'd like, I can send this to your mychart (if signed up) or email for you to review.  Otherwise, I can obtain your verbal consent now.  All virtual visits are billed to your insurance company just like a normal visit would be.  By agreeing to a virtual visit, we'd like you to understand that the technology does not allow for your provider to perform an examination, and thus may limit your provider's ability to fully assess your condition. If your provider identifies any concerns that need to be evaluated in person, we will make arrangements to do so.  Finally, though the technology is pretty good, we cannot assure that it will always work on either your or our end, and in the setting of a video visit, we may have to convert it to a phone-only visit.  In either situation, we cannot ensure that we have a secure connection.  Are you willing to proceed?"  Patient said "yes".  3. Advise patient to be prepared - "Two hours prior to your  appointment, go ahead and check your blood pressure, pulse, oxygen saturation, and your weight (if you have the equipment to check those) and write them all down. When your visit starts, your provider will ask you for this information. If you have an Apple Watch or Kardia device, please plan to have heart rate information ready on the day of your appointment. Please have a pen and paper handy nearby the day of the visit as well."  4. Give patient instructions for MyChart download to smartphone OR Doximity/Doxy.me as below if video visit (depending on what platform provider is using)  5. Inform patient they will receive a phone call 15 minutes prior to their appointment time (may be from unknown caller ID) so they should be prepared to answer    Brian Kerr has been deemed a candidate for a follow-up tele-health visit to limit community exposure during the Covid-19 pandemic. I spoke with the patient via phone to ensure availability of phone/video source, confirm preferred email & phone number, and discuss instructions and expectations.  I reminded Brian Kerr to be prepared with any vital sign and/or heart rhythm information that could potentially be obtained via home monitoring, at the time of his visit. I reminded Brian Kerr to expect a phone call prior to his visit.  Ardelle Anton 04/25/2019 2:01 PM  INSTRUCTIONS FOR DOWNLOADING THE MYCHART APP TO SMARTPHONE  - The patient must first make sure to have activated MyChart and know their login information - If Apple, go to [employer] and type in MyChart in the search bar and download the app. If Android, ask patient to go to [employer] and type in [place] in the search bar and download the app. The app is free but as with any other app downloads, their phone may require them to verify saved payment information or Apple/Android password.  - The patient will need to then log into the app with their MyChart  username and password, and select [facility] as their healthcare provider to link the account. When it is time for your visit, go to the MyChart app, find appointments, and click Begin Video Visit. Be sure to Select Allow for your device to access the Microphone and Camera for your visit. You will then be connected, and your provider will be with you shortly.  **If they have any issues connecting, or need assistance please contact MyChart service desk [phone])**  **If using a computer, in order to ensure the best quality for their visit they will need to use either of the following Internet Browsers: [employer], or Google Chrome**  IF USING DOXIMITY or DOXY.ME - The patient will receive a link just prior to their visit by text.     FULL LENGTH CONSENT FOR TELE-HEALTH VISIT   I hereby voluntarily request, consent and authorize [facility] and its employed or contracted physicians, physician assistants, nurse practitioners or other licensed health care professionals (the Practitioner), to provide me with telemedicine health care services (the Services") as deemed necessary by the treating Practitioner. I acknowledge and consent to receive the Services by the Practitioner via telemedicine. I understand that the telemedicine visit will involve communicating with the Practitioner through live audiovisual communication technology and the disclosure of certain medical information by electronic transmission. I acknowledge that I have been given the opportunity to request an in-person assessment or other available alternative prior to the telemedicine visit and am voluntarily participating in the telemedicine visit.  I understand that I have the right to withhold or withdraw my consent to the use of telemedicine in the course of my care at any time, without affecting my right to future care or treatment, and that the Practitioner or I may terminate the telemedicine visit at any  time. I understand that I have the right to inspect all information obtained and/or recorded in the course of the telemedicine visit and may receive copies of available information for a reasonable fee.  I understand that some of the potential risks of receiving the Services via telemedicine include:   Delay or interruption in medical evaluation due to technological equipment failure or disruption;  Information transmitted may not be sufficient (e.g. poor resolution of images) to allow for appropriate medical decision making by the Practitioner; and/or   In rare instances, security protocols could fail, causing a breach of personal health information.  Furthermore, I acknowledge that it is my responsibility to provide information about my medical history, conditions and care that is complete and accurate to the best of my ability. I acknowledge that Practitioner's advice, recommendations, and/or decision may be based on factors not within their control, such as incomplete or inaccurate data provided by me or distortions of diagnostic images or specimens that may result from electronic transmissions. I understand that the practice of medicine is not an exact science  and that Practitioner makes no warranties or guarantees regarding treatment outcomes. I acknowledge that I will receive a copy of this consent concurrently upon execution via email to the email address I last provided but may also request a printed copy by calling the office of [facility].    I understand that my insurance will be billed for this visit.   I have read or had this consent read to me.  I understand the contents of this consent, which adequately explains the benefits and risks of the Services being provided via telemedicine.   I have been provided ample opportunity to ask questions regarding this consent and the Services and have had my questions answered to my satisfaction.  I give my informed consent for the services  to be provided through the use of telemedicine in my medical care  By participating in this telemedicine visit I agree to the above.
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
## 2019-05-05 ENCOUNTER — Telehealth (INDEPENDENT_AMBULATORY_CARE_PROVIDER_SITE_OTHER): Payer: BLUE CROSS/BLUE SHIELD | Admitting: Physician Assistant

## 2019-05-05 ENCOUNTER — Encounter: Payer: Self-pay | Admitting: Physician Assistant

## 2019-05-05 ENCOUNTER — Other Ambulatory Visit: Payer: Self-pay

## 2019-05-05 VITALS — Ht 72.0 in | Wt 315.0 lb

## 2019-05-05 DIAGNOSIS — G4733 Obstructive sleep apnea (adult) (pediatric): Secondary | ICD-10-CM

## 2019-05-05 DIAGNOSIS — Q230 Congenital stenosis of aortic valve: Secondary | ICD-10-CM | POA: Diagnosis not present

## 2019-05-05 DIAGNOSIS — Q231 Congenital insufficiency of aortic valve: Secondary | ICD-10-CM

## 2019-05-05 DIAGNOSIS — E785 Hyperlipidemia, unspecified: Secondary | ICD-10-CM

## 2019-05-05 NOTE — Patient Instructions (Signed)
Medication Instructions:  Your physician recommends that you continue on your current medications as directed. Please refer to the Current Medication list given to you today.  If you need a refill on your cardiac medications before your next appointment, please call your pharmacy.   Lab work: NONE  If you have labs (blood work) drawn today and your tests are completely normal, you will receive your results only by: . MyChart Message (if you have MyChart) OR . A paper copy in the mail If you have any lab test that is abnormal or we need to change your treatment, we will call you to review the results.  Testing/Procedures: NONE  Follow-Up: At [facility], you and your health needs are our priority.  As part of our continuing mission to provide you with exceptional heart care, we have created designated Provider Care Teams.  These Care Teams include your primary Cardiologist (physician) and Advanced Practice Providers (APPs -  Physician Assistants and Nurse Practitioners) who all work together to provide you with the care you need, when you need it. You will need a follow up appointment in 6 months.  Please call our office 2 months in advance to schedule this appointment.  You may see Henry W Smith III, MD or one of the following Advanced Practice Providers on your designated Care Team:   Lori Gerhardt, NP Laura Ingold, NP . Jill McDaniel, NP     

## 2019-12-03 ENCOUNTER — Ambulatory Visit (INDEPENDENT_AMBULATORY_CARE_PROVIDER_SITE_OTHER): Payer: Self-pay | Admitting: Family Medicine

## 2019-12-03 DIAGNOSIS — J0100 Acute maxillary sinusitis, unspecified: Secondary | ICD-10-CM

## 2019-12-03 DIAGNOSIS — Z7189 Other specified counseling: Secondary | ICD-10-CM

## 2019-12-03 MED ORDER — AMOXICILLIN-POT CLAVULANATE 875-125 MG PO TABS: 1.0000 | ORAL_TABLET | Freq: Two times a day (BID) | ORAL | 0 refills | Status: DC

## 2019-12-03 NOTE — Patient Instructions (Signed)
Prevent the Spread of COVID-19 if You Are Sick If you are sick with COVID-19 or think you might have COVID-19, follow the steps below to help protect other people in your home and community. Stay home except to get medical care.  Stay home. Most people with COVID-19 have mild illness and are able to recover at home without medical care. Do not leave your home, except to get medical care. Do not visit public areas.  Take care of yourself. Get rest and stay hydrated.  Get medical care when needed. Call your doctor before you go to their office for care. But, if you have trouble breathing or other concerning symptoms, call 911 for immediate help.  Avoid public transportation, ride-sharing, or taxis. Separate yourself from other people and pets in your home.  As much as possible, stay in a specific room and away from other people and pets in your home. Also, you should use a separate bathroom, if available. If you need to be around other people or animals in or outside of the home, wear a cloth face covering. ? See COVID-19 and Animals if you have questions about pets: https://www.cdc.gov/coronavirus/2019-ncov/faq.html#COVID19animals Monitor your symptoms.  Common symptoms of COVID-19 include fever and cough. Trouble breathing is a more serious symptom that means you should get medical attention.  Follow care instructions from your healthcare provider and local health department. Your local health authorities will give instructions on checking your symptoms and reporting information. If you develop emergency warning signs for COVID-19 get medical attention immediately.  Emergency warning signs include*:  Trouble breathing  Persistent pain or pressure in the chest  New confusion or not able to be woken  Bluish lips or face *This list is not all inclusive. Please consult your medical provider for any other symptoms that are severe or concerning to you. Call 911 if you have a medical  emergency. If you have a medical emergency and need to call 911, notify the operator that you have or think you might have, COVID-19. If possible, put on a facemask before medical help arrives. Call ahead before visiting your doctor.  Call ahead. Many medical visits for routine care are being postponed or done by phone or telemedicine.  If you have a medical appointment that cannot be postponed, call your doctor's office. This will help the office protect themselves and other patients. If you are sick, wear a cloth covering over your nose and mouth.  You should wear a cloth face covering over your nose and mouth if you must be around other people or animals, including pets (even at home).  You don't need to wear the cloth face covering if you are alone. If you can't put on a cloth face covering (because of trouble breathing for example), cover your coughs and sneezes in some other way. Try to stay at least 6 feet away from other people. This will help protect the people around you. Note: During the COVID-19 pandemic, medical grade facemasks are reserved for healthcare workers and some first responders. You may need to make a cloth face covering using a scarf or bandana. Cover your coughs and sneezes.  Cover your mouth and nose with a tissue when you cough or sneeze.  Throw used tissues in a lined trash can.  Immediately wash your hands with soap and water for at least 20 seconds. If soap and water are not available, clean your hands with an alcohol-based hand sanitizer that contains at least 60% alcohol. Clean your hands often.    Wash your hands often with soap and water for at least 20 seconds. This is especially important after blowing your nose, coughing, or sneezing; going to the bathroom; and before eating or preparing food.  Use hand sanitizer if soap and water are not available. Use an alcohol-based hand sanitizer with at least 60% alcohol, covering all surfaces of your hands and rubbing  them together until they feel dry.  Soap and water are the best option, especially if your hands are visibly dirty.  Avoid touching your eyes, nose, and mouth with unwashed hands. Avoid sharing personal household items.  Do not share dishes, drinking glasses, cups, eating utensils, towels, or bedding with other people in your home.  Wash these items thoroughly after using them with soap and water or put them in the dishwasher. Clean all "high-touch" surfaces everyday.  Clean and disinfect high-touch surfaces in your "sick room" and bathroom. Let someone else clean and disinfect surfaces in common areas, but not your bedroom and bathroom.  If a caregiver or other person needs to clean and disinfect a sick person's bedroom or bathroom, they should do so on an as-needed basis. The caregiver/other person should wear a mask and wait as long as possible after the sick person has used the bathroom. High-touch surfaces include phones, remote controls, counters, tabletops, doorknobs, bathroom fixtures, toilets, keyboards, tablets, and bedside tables.  Clean and disinfect areas that may have blood, stool, or body fluids on them.  Use household cleaners and disinfectants. Clean the area or item with soap and water or another detergent if it is dirty. Then use a household disinfectant. ? Be sure to follow the instructions on the label to ensure safe and effective use of the product. Many products recommend keeping the surface wet for several minutes to ensure germs are killed. Many also recommend precautions such as wearing gloves and making sure you have good ventilation during use of the product. ? Most EPA-registered household disinfectants should be effective. How to discontinue home isolation  People with COVID-19 who have stayed home (home isolated) can stop home isolation under the following conditions: ? If you will not have a test to determine if you are still contagious, you can leave home  after these three things have happened:  You have had no fever for at least 72 hours (that is three full days of no fever without the use of medicine that reduces fevers) AND  other symptoms have improved (for example, when your cough or shortness of breath has improved) AND  at least 10 days have passed since your symptoms first appeared. ? If you will be tested to determine if you are still contagious, you can leave home after these three things have happened:  You no longer have a fever (without the use of medicine that reduces fevers) AND  other symptoms have improved (for example, when your cough or shortness of breath has improved) AND  you received two negative tests in a row, 24 hours apart. Your doctor will follow CDC guidelines. In all cases, follow the guidance of your healthcare provider and local health department. The decision to stop home isolation should be made in consultation with your healthcare provider and state and local health departments. Local decisions depend on local circumstances. cdc.gov/coronavirus 04/27/2019 This information is not intended to replace advice given to you by your health care provider. Make sure you discuss any questions you have with your health care provider. Document Released: 04/08/2019 Document Revised: 05/07/2019 Document Reviewed: 04/08/2019   Elsevier Patient Education  2020 [employer]  

## 2019-12-03 NOTE — Progress Notes (Signed)
Telephone visit  Subjective: [id number] PCP: Baruch Gouty, FNP [name] Brian Kerr is a 43 y.o. male calls for telephone consult today. Patient provides verbal consent for consult held via phone.  Location of patient: car Location of provider: [facility] Others present for call: son  1. Sinusitis Patient reports abrupt onset at work.  He reports sinus headache and congestion.  He took a sinus medication at work and it did cause some dizziness.  The dizziness has since resolved and the headache seems to be getting better as well.  He thinks this is onset of his typical sinusitis which often does lead to need of an antibiotic.  He denies any known sick contacts.  There have been several coworkers on and off medicine at shift that have been tested positive for Covid but his job has done a bit to keep the areas clean and sterile after these employees were sent home.  He denies any fevers.  No shortness of breath or coughing.   ROS: Per HPI  Allergies  Allergen Reactions  . Morphine And Related Other (See Comments)    irritable   Past Medical History:  Diagnosis Date  . Allergy    seasonal  . Blood transfusion without reported diagnosis   . Gallstones   . Iron deficiency anemia due to chronic blood loss 11/14/2017  . Obesity   . Sleep apnea     Current Outpatient Medications:  .  fluticasone (FLONASE) 50 MCG/ACT nasal spray, Place 2 sprays into both nostrils daily., Disp: 16 g, Rfl: 6  Assessment/ Plan: 43 y.o. male   1. Acute non-recurrent maxillary sinusitis I have gone ahead and given him a prescription for Augmentin to have on hand if symptoms or not improving.  Okay to continue over-the-counter remedies.  I did ask that he proceed with testing for COVID-19, particularly given positive cases at work and increasing cases locally.  He voiced good understanding.  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 - Novel Coronavirus, NAA ([facility]) - amoxicillin-clavulanate  (AUGMENTIN) 875-125 MG tablet; Take 1 tablet by mouth 2 (two) times daily.  Dispense: 20 tablet; Refill: 0  2. Advice given about COVID-19 virus by telephone - Novel Coronavirus, NAA ([facility])   Start time: 4:31pm End time: 4:39pm  Total time spent on patient care (including telephone call/ virtual visit): 15 minutes  [place], [place] [phone]

## 2020-03-23 ENCOUNTER — Other Ambulatory Visit: Payer: Self-pay | Admitting: Family Medicine

## 2020-03-23 DIAGNOSIS — J0100 Acute maxillary sinusitis, unspecified: Secondary | ICD-10-CM

## 2020-07-23 ENCOUNTER — Ambulatory Visit (INDEPENDENT_AMBULATORY_CARE_PROVIDER_SITE_OTHER): Payer: BC Managed Care – PPO | Admitting: Family Medicine

## 2020-07-23 ENCOUNTER — Ambulatory Visit (HOSPITAL_COMMUNITY)
Admission: RE | Admit: 2020-07-23 | Discharge: 2020-07-23 | Disposition: A | Discharge status: Home / Self Care | Payer: BC Managed Care – PPO | Source: Ambulatory Visit | Attending: Family Medicine | Admitting: Family Medicine

## 2020-07-23 ENCOUNTER — Encounter: Payer: Self-pay | Admitting: Family Medicine

## 2020-07-23 ENCOUNTER — Other Ambulatory Visit: Payer: Self-pay

## 2020-07-23 ENCOUNTER — Telehealth: Payer: Self-pay | Admitting: Family Medicine

## 2020-07-23 VITALS — BP 138/83 | HR 83 | Temp 97.5°F | Ht 72.0 in | Wt 318.4 lb

## 2020-07-23 DIAGNOSIS — M549 Dorsalgia, unspecified: Secondary | ICD-10-CM

## 2020-07-23 DIAGNOSIS — Z9889 Other specified postprocedural states: Secondary | ICD-10-CM

## 2020-07-23 DIAGNOSIS — M545 Low back pain: Secondary | ICD-10-CM | POA: Diagnosis not present

## 2020-07-23 MED ORDER — CYCLOBENZAPRINE HCL 10 MG PO TABS: 5.0000 mg | ORAL_TABLET | Freq: Three times a day (TID) | ORAL | 0 refills | Status: DC | PRN

## 2020-07-23 MED ORDER — PREDNISONE 10 MG (21) PO TBPK: ORAL_TABLET | ORAL | 0 refills | Status: DC

## 2020-07-23 NOTE — Telephone Encounter (Signed)
Xray results just came in. Can you review and let patient know what to do next?

## 2020-07-23 NOTE — Telephone Encounter (Signed)
done

## 2020-07-23 NOTE — Patient Instructions (Addendum)
Schedule your full physical with fasting labs.    If no improvement AT ALL by Monday, xray.   Acute Back Pain, Adult Acute back pain is sudden and usually short-lived. It is often caused by an injury to the muscles and tissues in the back. The injury may result from:  A muscle or ligament getting overstretched or torn (strained). Ligaments are tissues that connect bones to each other. Lifting something improperly can cause a back strain.  Wear and tear (degeneration) of the spinal disks. Spinal disks are circular tissue that provides cushioning between the bones of the spine (vertebrae).  Twisting motions, such as while playing sports or doing yard work.  A hit to the back.  Arthritis. You may have a physical exam, lab tests, and imaging tests to find the cause of your pain. Acute back pain usually goes away with rest and home care. Follow these instructions at home: Managing pain, stiffness, and swelling  Take over-the-counter and prescription medicines only as told by your health care provider.  Your health care provider may recommend applying ice during the first 24-48 hours after your pain starts. To do this: ? Put ice in a plastic bag. ? Place a towel between your skin and the bag. ? Leave the ice on for 20 minutes, 2-3 times a day.  If directed, apply heat to the affected area as often as told by your health care provider. Use the heat source that your health care provider recommends, such as a moist heat pack or a heating pad. ? Place a towel between your skin and the heat source. ? Leave the heat on for 20-30 minutes. ? Remove the heat if your skin turns bright red. This is especially important if you are unable to feel pain, heat, or cold. You have a greater risk of getting burned. Activity   Do not stay in bed. Staying in bed for more than 1-2 days can delay your recovery.  Sit up and stand up straight. Avoid leaning forward when you sit, or hunching over when you  stand. ? If you work at a desk, sit close to it so you do not need to lean over. Keep your chin tucked in. Keep your neck drawn back, and keep your elbows bent at a right angle. Your arms should look like the letter "L." ? Sit high and close to the steering wheel when you drive. Add lower back (lumbar) support to your car seat, if needed.  Take short walks on even surfaces as soon as you are able. Try to increase the length of time you walk each day.  Do not sit, drive, or stand in one place for more than 30 minutes at a time. Sitting or standing for long periods of time can put stress on your back.  Do not drive or use heavy machinery while taking prescription pain medicine.  Use proper lifting techniques. When you bend and lift, use positions that put less stress on your back: ? [place] your knees. ? Keep the load close to your body. ? Avoid twisting.  Exercise regularly as told by your health care provider. Exercising helps your back heal faster and helps prevent back injuries by keeping muscles strong and flexible.  Work with a physical therapist to make a safe exercise program, as recommended by your health care provider. Do any exercises as told by your physical therapist. Lifestyle  Maintain a healthy weight. Extra weight puts stress on your back and makes it difficult to  have good posture.  Avoid activities or situations that make you feel anxious or stressed. Stress and anxiety increase muscle tension and can make back pain worse. Learn ways to manage anxiety and stress, such as through exercise. General instructions  Sleep on a firm mattress in a comfortable position. Try lying on your side with your knees slightly bent. If you lie on your back, put a pillow under your knees.  Follow your treatment plan as told by your health care provider. This may include: ? Cognitive or behavioral therapy. ? Acupuncture or massage therapy. ? Meditation or yoga. Contact a health care provider  if:  You have pain that is not relieved with rest or medicine.  You have increasing pain going down into your legs or buttocks.  Your pain does not improve after 2 weeks.  You have pain at night.  You lose weight without trying.  You have a fever or chills. Get help right away if:  You develop new bowel or bladder control problems.  You have unusual weakness or numbness in your arms or legs.  You develop nausea or vomiting.  You develop abdominal pain.  You feel faint. Summary  Acute back pain is sudden and usually short-lived.  Use proper lifting techniques. When you bend and lift, use positions that put less stress on your back.  Take over-the-counter and prescription medicines and apply heat or ice as directed by your health care provider. This information is not intended to replace advice given to you by your health care provider. Make sure you discuss any questions you have with your health care provider. Document Revised: 04/01/2019 Document Reviewed: 07/25/2017 Elsevier Patient Education  [place].
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
## 2020-07-23 NOTE — Telephone Encounter (Signed)
Kim calling to see if the x-ray results have came in for the pt yet. She states he is in a lot of pain and hoping to find out more information before the weekend.

## 2020-10-29 ENCOUNTER — Other Ambulatory Visit: Payer: BC Managed Care – PPO

## 2020-11-02 ENCOUNTER — Encounter: Payer: BC Managed Care – PPO | Admitting: Family Medicine

## 2020-11-04 ENCOUNTER — Encounter: Payer: Self-pay | Admitting: Family Medicine

## 2020-12-21 ENCOUNTER — Telehealth: Payer: Self-pay | Admitting: Family Medicine

## 2020-12-21 NOTE — Telephone Encounter (Signed)
Pt put on a waiting list yesterday for back pain and his wife is calling requesting to talk to Dr Timoteo Expose nurse and says he needs an x ray

## 2020-12-21 NOTE — Telephone Encounter (Signed)
Pt's wife states he is in excruciating pain due to his back and he has to go back to work Monday so she wanted him to be seen for xray this week. Advised we don't have any openings at this time but he is on the wait list if anyone cancels. She states [facility] will [facility] go to [facility].

## 2020-12-23 DIAGNOSIS — S338XXA Sprain of other parts of lumbar spine and pelvis, initial encounter: Secondary | ICD-10-CM | POA: Diagnosis not present

## 2020-12-23 DIAGNOSIS — S233XXA Sprain of ligaments of thoracic spine, initial encounter: Secondary | ICD-10-CM | POA: Diagnosis not present

## 2020-12-27 DIAGNOSIS — S233XXA Sprain of ligaments of thoracic spine, initial encounter: Secondary | ICD-10-CM | POA: Diagnosis not present

## 2020-12-27 DIAGNOSIS — S338XXA Sprain of other parts of lumbar spine and pelvis, initial encounter: Secondary | ICD-10-CM | POA: Diagnosis not present

## 2021-01-03 DIAGNOSIS — S233XXA Sprain of ligaments of thoracic spine, initial encounter: Secondary | ICD-10-CM | POA: Diagnosis not present

## 2021-01-03 DIAGNOSIS — S338XXA Sprain of other parts of lumbar spine and pelvis, initial encounter: Secondary | ICD-10-CM | POA: Diagnosis not present

## 2021-08-10 ENCOUNTER — Ambulatory Visit: Payer: BC Managed Care – PPO | Admitting: Family Medicine

## 2021-08-10 ENCOUNTER — Other Ambulatory Visit: Payer: Self-pay

## 2021-08-10 ENCOUNTER — Encounter: Payer: Self-pay | Admitting: Family Medicine

## 2021-08-10 VITALS — BP 155/95 | HR 79 | Temp 98.0°F | Ht 72.0 in | Wt 330.4 lb

## 2021-08-10 DIAGNOSIS — R1310 Dysphagia, unspecified: Secondary | ICD-10-CM

## 2021-08-10 DIAGNOSIS — R0600 Dyspnea, unspecified: Secondary | ICD-10-CM

## 2021-08-10 DIAGNOSIS — Q231 Congenital insufficiency of aortic valve: Secondary | ICD-10-CM

## 2021-08-10 DIAGNOSIS — G4733 Obstructive sleep apnea (adult) (pediatric): Secondary | ICD-10-CM

## 2021-08-10 DIAGNOSIS — R0609 Other forms of dyspnea: Secondary | ICD-10-CM

## 2021-08-10 DIAGNOSIS — Z8042 Family history of malignant neoplasm of prostate: Secondary | ICD-10-CM

## 2021-08-10 DIAGNOSIS — I10 Essential (primary) hypertension: Secondary | ICD-10-CM

## 2021-08-10 DIAGNOSIS — Q230 Congenital stenosis of aortic valve: Secondary | ICD-10-CM | POA: Diagnosis not present

## 2021-08-10 DIAGNOSIS — R600 Localized edema: Secondary | ICD-10-CM | POA: Diagnosis not present

## 2021-08-10 DIAGNOSIS — E7800 Pure hypercholesterolemia, unspecified: Secondary | ICD-10-CM

## 2021-08-10 DIAGNOSIS — I350 Nonrheumatic aortic (valve) stenosis: Secondary | ICD-10-CM

## 2021-08-10 DIAGNOSIS — Q8901 Asplenia (congenital): Secondary | ICD-10-CM

## 2021-08-10 LAB — BAYER DCA HB A1C WAIVED: HB A1C (BAYER DCA - WAIVED): 5.6 % (ref <7.0)

## 2021-08-10 MED ORDER — TRIAMTERENE-HCTZ 37.5-25 MG PO TABS: 1.0000 | ORAL_TABLET | Freq: Every day | ORAL | 3 refills | Status: AC

## 2021-08-10 NOTE — Progress Notes (Signed)
Subjective: CC: Dysphagia PCP: Janora Norlander, DO NMM:[facility] Brian Kerr is a 45 y.o. male is accompanied by his wife today's appointment.  He is presenting to clinic today for multiple concerns including:  1.  Dysphagia Patient reports that he has had over 24-monthhistory of food getting caught in his throat where he has to regurgitate them and this is accompanied by quite a bit of mucus.  He identifies meats as the primary issue.  Denies any issues with liquids.  Denies any GERD symptoms.  Has had GI issues in the past including history of GI bleed.  He has had previous colonoscopies with last one done approximately 2 years ago.  Does not report any rectal bleeding would like to have these labs collected today.  2.  Asplenia Patient has history of splenectomy.  He wants to make sure that he is up-to-date on vaccinations.  He does admit that he does not get the influenza vaccine and does not plan on doing so but believes that he is up-to-date on all other vaccines.  3.  Aortic stenosis due to bicuspid valve/hypertension/obstructive sleep apnea on CPAP Patient is under the care of cardiology but has not seen them in many years now.  His last visit was a video visit in 2020.  He admits to lower extremity edema, having some dyspnea on exertion intermittently but believes that this is related to COVID infection.  He does not follow a strict diet but is compliant with his CPAP.  He does believe he needs new equipment.  Does not report any chest pain.   ROS: Per HPI  Allergies  Allergen Reactions   Morphine And Related Other (See Comments)    irritable   Past Medical History:  Diagnosis Date   Allergy    seasonal   Blood transfusion without reported diagnosis    Gallstones    Iron deficiency anemia due to chronic blood loss 11/14/2017   Obesity    Sleep apnea     Current Outpatient Medications:    fluticasone (FLONASE) 50 MCG/ACT nasal spray, SPRAY 2 SPRAYS INTO EACH NOSTRIL EVERY  DAY, Disp: 48 mL, Rfl: 1 Social History   Socioeconomic History   Marital status: Married    Spouse name: Not on file   Number of children: 2   Years of education: Not on file   Highest education level: Not on file  Occupational History   Occupation: salesman- cutting tools  Tobacco Use   Smoking status: Former   Smokeless tobacco: Former    Types: Chew, Snuff  Vaping Use   Vaping Use: Never used  Substance and Sexual Activity   Alcohol use: Yes    Comment: occasional   Drug use: No   Sexual activity: Yes    Partners: Female  Other Topics Concern   Not on file  Social History Narrative   Not on file   Social Determinants of Health   Financial Resource Strain: Not on file  Food Insecurity: Not on file  Transportation Needs: Not on file  Physical Activity: Not on file  Stress: Not on file  Social Connections: Not on file  Intimate Partner Violence: Not on file   Family History  Problem Relation Age of Onset   Cancer Mother        liver   Diabetes Mother    Colon cancer Mother 562  Cancer Paternal Grandmother        lymph nodes   Cancer Paternal Grandfather  prostate    Objective: Office vital signs reviewed. BP (!) 162/106   Pulse 79   Temp 98 F (36.7 C)   Ht 6' (1.829 m)   Wt (!) 330 lb 6.4 oz (149.9 kg)   SpO2 95%   BMI 44.81 kg/m   Physical Examination:  General: Awake, alert, morbid obesity, No acute distress HEENT: Normal; sclera white.  Moist mucous membranes.  No exophthalmos or goiter Cardio: regular rate and rhythm, T4H9 heard, systolic murmur noted over the right sternal border that radiates to bilateral carotids Pulm: clear to auscultation bilaterally, no wheezes, rhonchi or rales; normal work of breathing on room air GI: Obese Extremities: warm, well perfused, 1+ pitting edema to mid shins , no cyanosis or clubbing; +2 pulses bilaterally MSK: Ambulating independently  Assessment/ Plan: 45 y.o. male   Uncontrolled hypertension  - Plan: triamterene-hydrochlorothiazide (MAXZIDE-25) 37.5-25 MG tablet  Aortic stenosis due to bicuspid aortic valve - Plan: CMP14+EGFR, Lipid panel, TSH, Ambulatory referral to Cardiology, TSH, Lipid panel, CMP14+EGFR  Bilateral edema of lower extremity - Plan: CBC with Differential, triamterene-hydrochlorothiazide (MAXZIDE-25) 37.5-25 MG tablet, CBC with Differential  Dyspnea on exertion - Plan: CBC with Differential, CBC with Differential  Pure hypercholesterolemia - Plan: CMP14+EGFR, Lipid panel, TSH, TSH, Lipid panel, CMP14+EGFR  OSA (obstructive sleep apnea) - Plan: For home use only DME continuous positive airway pressure (CPAP)  Morbid obesity (HCC) - Plan: Bayer DCA Hb A1c Waived, Bayer DCA Hb A1c Waived  Family history of malignant neoplasm of prostate - Plan: PSA, PSA  Dysphagia, unspecified type - Plan: Ambulatory referral to Gastroenterology, CBC with Differential, CBC with Differential  Asplenia  Blood pressure grossly uncontrolled.  He has had 2 separate elevations in blood pressure and therefore we will treat hypertension with triamterene-hydrochlorothiazide.  Lower extremity edema was noted today.  Given his history of aortic stenosis, morbid obesity and OSA I would favor that he see his cardiologist for checkup in person soon.  May benefit from repeat echocardiogram, EKG.  We will also check for metabolic etiology of edema.  CMP, TSH, CBC ordered.  Reinforced low-salt diet and monitoring of blood pressures closely with goal blood pressure of less than 140/90.  With he and his wife was good understanding.  Fasting lipid panel also ordered.  Not currently treated with statin but likely would benefit.  Compliant with CPAP.  Needs new supplies and so DME was provided today.  Weight loss reinforced.  Could consider medical assistance with this if he desires  Given family history of prostate cancer we will check PSA.  Patient is asymptomatic  Having some dysphagia.  Question  stricture.  Referral to gastroenterology for further evaluation placed.  May have silent GERD which would benefit from PPI but he overtly denied any acid reflux symptoms today.  He is currently up-to-date on pneumococcal vaccination.  I gave him a handout on recommended vaccinations for asplenia.  No orders of the defined types were placed in this encounter.  No orders of the defined types were placed in this encounter.    Janora Norlander, DO [place] [phone]

## 2021-08-10 NOTE — Progress Notes (Signed)
Pt calling back. Please call after 3:30 if possible

## 2021-08-10 NOTE — Patient Instructions (Addendum)
Asplenia and Adult Vaccination Vaccines are especially critical for people with chronic health conditions such as asplenia.  If you do not have a spleen or your spleen does not work well, talk with your doctor about:  Influenza vaccine each year to protect against seasonal flu Tdap vaccine to protect against tetanus, diphtheria, and whooping cough Hib vaccine to protect against Haemophilus influenzae type b (Hib) if you were not previously vaccinated with the vaccine Pneumococcal vaccine(s) to protect against pneumonia and other serious pneumococcal diseases Meningococcal vaccines (both types) to protect against meningitis and other meningococcal disease Zoster vaccine to protect against shingles if you are 21 years and older HPV vaccine series to protect against human papillomavirus if you are a man up to age 19 or woman up to age 54 MMR vaccine to protect against measles, mumps, and rubella if you were born in [birth year] or after and have not gotten this vaccine or do not have immunity to these diseases Varicella vaccine to protect against chickenpox if you were born in 1980 or after and have not gotten two doses of this vaccine or do not have immunity to this disease  You had labs performed today.  You will be contacted with the results of the labs once they are available, usually in the next 3 business days for routine lab work.  If you have an active my chart account, they will be released to your MyChart.  If you prefer to have these labs released to you via telephone, please let us know.  If you had a pap smear or biopsy performed, expect to be contacted in about 7-10 days.

## 2021-08-11 ENCOUNTER — Other Ambulatory Visit: Payer: Self-pay | Admitting: *Deleted

## 2021-08-11 LAB — LIPID PANEL
Chol/HDL Ratio: 7.5 ratio — ABNORMAL HIGH (ref 0.0–5.0)
Cholesterol, Total: 271 mg/dL — ABNORMAL HIGH (ref 100–199)
HDL: 36 mg/dL — ABNORMAL LOW (ref >39)
LDL Chol Calc (NIH): 210 mg/dL — ABNORMAL HIGH (ref 0–99)
Triglycerides: 133 mg/dL (ref 0–149)
VLDL Cholesterol Cal: 25 mg/dL (ref 5–40)

## 2021-08-11 LAB — CBC WITH DIFFERENTIAL/PLATELET
Basophils Absolute: 0.1 10*3/uL (ref 0.0–0.2)
Basos: 1 %
EOS (ABSOLUTE): 0.3 10*3/uL (ref 0.0–0.4)
Eos: 3 %
Hematocrit: 50.9 % (ref 37.5–51.0)
Hemoglobin: 17.8 g/dL — ABNORMAL HIGH (ref 13.0–17.7)
Immature Grans (Abs): 0 10*3/uL (ref 0.0–0.1)
Immature Granulocytes: 0 %
Lymphocytes Absolute: 3.2 10*3/uL — ABNORMAL HIGH (ref 0.7–3.1)
Lymphs: 34 %
MCH: 30.9 pg (ref 26.6–33.0)
MCHC: 35 g/dL (ref 31.5–35.7)
MCV: 88 fL (ref 79–97)
Monocytes Absolute: 0.8 10*3/uL (ref 0.1–0.9)
Monocytes: 8 %
Neutrophils Absolute: 5.2 10*3/uL (ref 1.4–7.0)
Neutrophils: 54 %
Platelets: 380 10*3/uL (ref 150–450)
RBC: 5.76 x10E6/uL (ref 4.14–5.80)
RDW: 13.5 % (ref 11.6–15.4)
WBC: 9.7 10*3/uL (ref 3.4–10.8)

## 2021-08-11 LAB — CMP14+EGFR
ALT: 42 IU/L (ref 0–44)
AST: 44 IU/L — ABNORMAL HIGH (ref 0–40)
Albumin/Globulin Ratio: 1.6 (ref 1.2–2.2)
Albumin: 4.6 g/dL (ref 4.0–5.0)
Alkaline Phosphatase: 79 IU/L (ref 44–121)
BUN/Creatinine Ratio: 13 (ref 9–20)
BUN: 14 mg/dL (ref 6–24)
Bilirubin Total: 0.6 mg/dL (ref 0.0–1.2)
CO2: 24 mmol/L (ref 20–29)
Calcium: 9.8 mg/dL (ref 8.7–10.2)
Chloride: 102 mmol/L (ref 96–106)
Creatinine, Ser: 1.05 mg/dL (ref 0.76–1.27)
Globulin, Total: 2.9 g/dL (ref 1.5–4.5)
Glucose: 109 mg/dL — ABNORMAL HIGH (ref 65–99)
Potassium: 5.3 mmol/L — ABNORMAL HIGH (ref 3.5–5.2)
Sodium: 141 mmol/L (ref 134–144)
Total Protein: 7.5 g/dL (ref 6.0–8.5)
eGFR: 89 mL/min/{1.73_m2} (ref >59)

## 2021-08-11 LAB — PSA: Prostate Specific Ag, Serum: 0.8 ng/mL (ref 0.0–4.0)

## 2021-08-11 LAB — TSH: TSH: 2.21 u[IU]/mL (ref 0.450–4.500)

## 2021-08-11 MED ORDER — ROSUVASTATIN CALCIUM 10 MG PO TABS: 10.0000 mg | ORAL_TABLET | Freq: Every day | ORAL | 0 refills | Status: DC

## 2021-09-20 ENCOUNTER — Encounter: Payer: BC Managed Care – PPO | Admitting: Family Medicine

## 2021-11-09 ENCOUNTER — Encounter: Payer: Self-pay | Admitting: Family Medicine

## 2021-11-09 ENCOUNTER — Other Ambulatory Visit: Payer: Self-pay

## 2021-11-09 ENCOUNTER — Ambulatory Visit (INDEPENDENT_AMBULATORY_CARE_PROVIDER_SITE_OTHER): Payer: BC Managed Care – PPO | Admitting: Family Medicine

## 2021-11-09 VITALS — BP 162/102 | HR 95 | Temp 97.8°F | Ht 72.0 in | Wt 355.6 lb

## 2021-11-09 DIAGNOSIS — Z0000 Encounter for general adult medical examination without abnormal findings: Secondary | ICD-10-CM

## 2021-11-09 DIAGNOSIS — I10 Essential (primary) hypertension: Secondary | ICD-10-CM

## 2021-11-09 DIAGNOSIS — M62830 Muscle spasm of back: Secondary | ICD-10-CM

## 2021-11-09 DIAGNOSIS — E7800 Pure hypercholesterolemia, unspecified: Secondary | ICD-10-CM

## 2021-11-09 DIAGNOSIS — Z23 Encounter for immunization: Secondary | ICD-10-CM

## 2021-11-09 DIAGNOSIS — Z0001 Encounter for general adult medical examination with abnormal findings: Secondary | ICD-10-CM

## 2021-11-09 MED ORDER — AMLODIPINE BESYLATE 5 MG PO TABS: 5.0000 mg | ORAL_TABLET | Freq: Every day | ORAL | 3 refills | Status: AC

## 2021-11-09 MED ORDER — METHOCARBAMOL 500 MG PO TABS: 250.0000 mg | ORAL_TABLET | Freq: Three times a day (TID) | ORAL | 0 refills | Status: AC | PRN
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
## 2021-11-09 NOTE — Patient Instructions (Addendum)
We talked about Saxenda (once daily injection), Wegovy (once weekly), Victoza (once daily for Diabetes but can use in weight sometimes) and Ozempic (once weekly for diabetes but can be used in weight)  Check with insurance as to what they will cover for WEIGHT loss.  Start with 1/2 tablet of the Amlodipine daily for 1 week, then can go to full tablet.  Hopefully, this will help you not be worn out. Keep taking the other meds as directed

## 2021-11-10 ENCOUNTER — Encounter: Payer: Self-pay | Admitting: Internal Medicine

## 2021-11-10 LAB — HEPATIC FUNCTION PANEL
ALT: 47 IU/L — ABNORMAL HIGH (ref 0–44)
AST: 39 IU/L (ref 0–40)
Albumin: 4.4 g/dL (ref 4.0–5.0)
Alkaline Phosphatase: 64 IU/L (ref 44–121)
Bilirubin Total: 0.5 mg/dL (ref 0.0–1.2)
Bilirubin, Direct: 0.13 mg/dL (ref 0.00–0.40)
Total Protein: 7.2 g/dL (ref 6.0–8.5)

## 2021-11-10 LAB — LDL CHOLESTEROL, DIRECT: LDL Direct: 121 mg/dL — ABNORMAL HIGH (ref 0–99)
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
## 2021-12-21 ENCOUNTER — Ambulatory Visit: Payer: BC Managed Care – PPO | Admitting: Family Medicine

## 2021-12-21 ENCOUNTER — Telehealth: Payer: Self-pay | Admitting: *Deleted

## 2021-12-21 VITALS — BP 126/79 | HR 83 | Ht 72.0 in | Wt 341.0 lb

## 2021-12-21 DIAGNOSIS — R7301 Impaired fasting glucose: Secondary | ICD-10-CM

## 2021-12-21 DIAGNOSIS — E7800 Pure hypercholesterolemia, unspecified: Secondary | ICD-10-CM

## 2021-12-21 DIAGNOSIS — E669 Obesity, unspecified: Secondary | ICD-10-CM

## 2021-12-21 DIAGNOSIS — E8881 Metabolic syndrome: Secondary | ICD-10-CM | POA: Diagnosis not present

## 2021-12-21 DIAGNOSIS — I10 Essential (primary) hypertension: Secondary | ICD-10-CM

## 2021-12-21 MED ORDER — ROSUVASTATIN CALCIUM 10 MG PO TABS: 10.0000 mg | ORAL_TABLET | Freq: Every day | ORAL | 3 refills | Status: AC

## 2021-12-21 MED ORDER — OZEMPIC (0.25 OR 0.5 MG/DOSE) 2 MG/1.5ML ~~LOC~~ SOPN: 0.5000 mg | PEN_INJECTOR | SUBCUTANEOUS | 12 refills | Status: DC

## 2021-12-21 NOTE — Progress Notes (Signed)
Subjective: CC: Hypertension PCP: Brian Norlander, DO LKT:GYBW L Greulich is a 45 y.o. male presenting to clinic today for:  1.  Hypertension associated with morbid obesity Blood pressure was noted to be uncontrolled last visit.  He was started on Norvasc 2.5 mg and advised to titrate up to 5 mg pending blood pressure response.  Has had some mild pedal edema but overall tolerating the Norvasc without difficulty.  No chest pain, shortness of breath, change in physical activity tolerance but he does admit that he is not especially physically active.  He is really wanting to pursue medication interventions for morbid obesity.  His wife apparently is treated with [facility] and he wants to know if he is a candidate for that.  No personal or family history of medullary thyroid cancers or multiple endocrine neoplasia type II.  No personal history of pancreatitis.   ROS: Per HPI  Allergies  Allergen Reactions   Morphine And Related Other (See Comments)    irritable   Past Medical History:  Diagnosis Date   Allergy    seasonal   Blood transfusion without reported diagnosis    Gallstones    Iron deficiency anemia due to chronic blood loss 11/14/2017   Obesity    Sleep apnea     Current Outpatient Medications:    amLODipine (NORVASC) 5 MG tablet, Take 1 tablet (5 mg total) by mouth daily., Disp: 90 tablet, Rfl: 3   fluticasone (FLONASE) 50 MCG/ACT nasal spray, SPRAY 2 SPRAYS INTO EACH NOSTRIL EVERY DAY, Disp: 48 mL, Rfl: 1   methocarbamol (ROBAXIN) 500 MG tablet, Take 0.5-1 tablets (250-500 mg total) by mouth every 8 (eight) hours as needed for muscle spasms., Disp: 20 tablet, Rfl: 0   rosuvastatin (CRESTOR) 10 MG tablet, Take 1 tablet (10 mg total) by mouth daily., Disp: 90 tablet, Rfl: 0   triamterene-hydrochlorothiazide (MAXZIDE-25) 37.5-25 MG tablet, Take 1 tablet by mouth daily., Disp: 90 tablet, Rfl: 3 Social History   Socioeconomic History   Marital status: Married    Spouse  name: Not on file   Number of children: 2   Years of education: Not on file   Highest education level: Not on file  Occupational History   Occupation: salesman- cutting tools  Tobacco Use   Smoking status: Former   Smokeless tobacco: Former    Types: Chew, Snuff  Vaping Use   Vaping Use: Never used  Substance and Sexual Activity   Alcohol use: Yes    Comment: occasional   Drug use: No   Sexual activity: Yes    Partners: Female  Other Topics Concern   Not on file  Social History Narrative   Not on file   Social Determinants of Health   Financial Resource Strain: Not on file  Food Insecurity: Not on file  Transportation Needs: Not on file  Physical Activity: Not on file  Stress: Not on file  Social Connections: Not on file  Intimate Partner Violence: Not on file   Family History  Problem Relation Age of Onset   Cancer Mother        liver   Diabetes Mother    Colon cancer Mother 21   Cancer Paternal Grandmother        lymph nodes   Cancer Paternal Grandfather        prostate    Objective: Office vital signs reviewed. BP 126/79    Pulse 83    Ht 6' (1.829 m)    [facility])  341 lb (154.7 kg)    SpO2 94%    BMI 46.25 kg/m   Physical Examination:  General: Awake, alert, morbidly obese male, No acute distress GI: Central obesity present Skin: No acanthosis nigricans appreciated Extremities: Warm well perfused with trace edema to the ankles  Assessment/ Plan: 45 y.o. male   Impaired fasting glucose - Plan: Semaglutide,0.25 or 0.5MG /DOS, (OZEMPIC, 0.25 OR 0.5 MG/DOSE,) 2 MG/1.5ML SOPN  Abdominal obesity and metabolic syndrome - Plan: Semaglutide,0.25 or 0.5MG /DOS, (OZEMPIC, 0.25 OR 0.5 MG/DOSE,) 2 MG/1.5ML SOPN  Essential hypertension - Plan: Semaglutide,0.25 or 0.5MG /DOS, (OZEMPIC, 0.25 OR 0.5 MG/DOSE,) 2 MG/1.5ML SOPN  Pure hypercholesterolemia - Plan: Semaglutide,0.25 or 0.5MG /DOS, (OZEMPIC, 0.25 OR 0.5 MG/DOSE,) 2 MG/1.5ML SOPN  Absolutely recommend that he  pursue an aggressive weight loss program.  He will be having full fasting labs, body measurements, etc. done with work soon and will provide these to me.  We discussed treatment options including GLP class of medication.  Not sure if his insurance will pay for Saxenda or Wegovy but will try and pursue Ozempic for impaired fasting glucose/metabolic syndrome.  We discussed the potential for noncoverage of this medication.  He will contact his insurance company if that ends up being the case.  I have given him a sample of the Ozempic today.  He understands possible side effects and reasons for reevaluation.    Blood pressure now well controlled with Norvasc but having some pedal edema.  Hopefully this will gradually get better as his weight improves.  We will see each other again in 2 months  No orders of the defined types were placed in this encounter.  No orders of the defined types were placed in this encounter.    Brian Norlander, DO [place] [phone]

## 2021-12-21 NOTE — Telephone Encounter (Signed)
Kedrick Fingerhut (Key: [id number]) Rx #: [id number] Ozempic (0.25 or 0.5 MG/DOSE) 2MG /1.5ML pen-injectors  PA asked if pt has tried Metformin = this may come back as a required trial to get this covered.  Several questions also asked about DM2 - which pt does not currently have.   Your information has been sent to [facility].

## 2021-12-21 NOTE — Patient Instructions (Signed)
Start 0.25mg  injected subcutaneously every 7 days for 1 month.  Then switch to 0.5mg  pen that has been sent to pharmacy.  HOPEFULLY, your insurance will cover this.

## 2021-12-22 NOTE — Telephone Encounter (Signed)
[facility] (KeyOrson Eva) - [id number] Ozempic (0.25 or 0.5 MG/DOSE) 2MG /1.5ML pen-injectors     Status: PA Response - Denied

## 2021-12-23 NOTE — Telephone Encounter (Signed)
Please inform pt and ask him to reach out to his ins as we discussed previously to see what they cover for weight loss.

## 2021-12-23 NOTE — Telephone Encounter (Signed)
Patient aware and verbalizes understanding. 

## 2021-12-27 ENCOUNTER — Ambulatory Visit: Payer: BC Managed Care – PPO | Admitting: Physician Assistant

## 2022-02-21 ENCOUNTER — Ambulatory Visit: Payer: BC Managed Care – PPO | Admitting: Family Medicine

## 2022-02-21 ENCOUNTER — Encounter: Payer: Self-pay | Admitting: Family Medicine

## 2022-05-08 ENCOUNTER — Other Ambulatory Visit: Payer: Self-pay | Admitting: Family Medicine

## 2022-05-08 DIAGNOSIS — I10 Essential (primary) hypertension: Secondary | ICD-10-CM

## 2022-05-08 DIAGNOSIS — R600 Localized edema: Secondary | ICD-10-CM

## 2022-05-13 ENCOUNTER — Encounter (HOSPITAL_COMMUNITY): Payer: Self-pay | Admitting: Emergency Medicine

## 2022-05-13 ENCOUNTER — Emergency Department (HOSPITAL_COMMUNITY)
Admission: EM | Admit: 2022-05-13 | Discharge: 2022-05-13 | Disposition: A | Discharge status: Home / Self Care | Payer: BC Managed Care – PPO | Attending: Emergency Medicine | Admitting: Emergency Medicine

## 2022-05-13 ENCOUNTER — Emergency Department (HOSPITAL_COMMUNITY): Payer: BC Managed Care – PPO

## 2022-05-13 DIAGNOSIS — R600 Localized edema: Secondary | ICD-10-CM | POA: Diagnosis not present

## 2022-05-13 DIAGNOSIS — S4991XA Unspecified injury of right shoulder and upper arm, initial encounter: Secondary | ICD-10-CM | POA: Diagnosis not present

## 2022-05-13 DIAGNOSIS — M25571 Pain in right ankle and joints of right foot: Secondary | ICD-10-CM | POA: Insufficient documentation

## 2022-05-13 DIAGNOSIS — M25572 Pain in left ankle and joints of left foot: Secondary | ICD-10-CM | POA: Diagnosis not present

## 2022-05-13 DIAGNOSIS — Z79899 Other long term (current) drug therapy: Secondary | ICD-10-CM | POA: Diagnosis not present

## 2022-05-13 DIAGNOSIS — M25511 Pain in right shoulder: Secondary | ICD-10-CM | POA: Diagnosis not present

## 2022-05-13 DIAGNOSIS — S82832A Other fracture of upper and lower end of left fibula, initial encounter for closed fracture: Secondary | ICD-10-CM | POA: Insufficient documentation

## 2022-05-13 DIAGNOSIS — S8992XA Unspecified injury of left lower leg, initial encounter: Secondary | ICD-10-CM | POA: Diagnosis not present

## 2022-05-13 DIAGNOSIS — M19011 Primary osteoarthritis, right shoulder: Secondary | ICD-10-CM | POA: Diagnosis not present

## 2022-05-13 MED ORDER — OXYCODONE-ACETAMINOPHEN 5-325 MG PO TABS: 1.0000 | ORAL_TABLET | Freq: Four times a day (QID) | ORAL | 0 refills | Status: AC | PRN

## 2022-05-13 MED ORDER — OXYCODONE-ACETAMINOPHEN 5-325 MG PO TABS: 1.0000 | ORAL_TABLET | Freq: Four times a day (QID) | ORAL | 0 refills | Status: DC | PRN

## 2022-05-13 MED ORDER — OXYCODONE-ACETAMINOPHEN 5-325 MG PO TABS
1.0000 | ORAL_TABLET | Freq: Once | ORAL | Status: AC
  Administered 2022-05-13: 1 via ORAL
  Filled 2022-05-13: qty 1
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
## 2022-05-13 NOTE — Progress Notes (Signed)
Orthopedic Tech Progress Note Patient Details:  AMORE GRATER 02-09-1976 [id number]  Ortho Devices Type of Ortho Device: Ace wrap, Crutches, Post (short leg) splint, Stirrup splint Ortho Device/Splint Location: left Ortho Device/Splint Interventions: Application   Post Interventions Patient Tolerated: Well, Ambulated well Instructions Provided: Care of device, Adjustment of device  [place] Pink 05/13/2022, 6:36 PM
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
## 2022-05-13 NOTE — ED Notes (Signed)
Waiting for son to pick patient up. Patient unable to drive at this time.
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
## 2022-05-17 ENCOUNTER — Ambulatory Visit: Payer: BC Managed Care – PPO | Admitting: Orthopedic Surgery

## 2022-05-19 ENCOUNTER — Telehealth: Payer: Self-pay | Admitting: Orthopedic Surgery

## 2022-05-19 ENCOUNTER — Encounter: Payer: Self-pay | Admitting: Orthopedic Surgery

## 2022-05-19 ENCOUNTER — Ambulatory Visit: Payer: BC Managed Care – PPO | Admitting: Orthopedic Surgery

## 2022-05-19 DIAGNOSIS — S82892A Other fracture of left lower leg, initial encounter for closed fracture: Secondary | ICD-10-CM

## 2022-05-19 NOTE — Telephone Encounter (Signed)
Pt submitted 2 medical release forms, short term disability forms, and $50.00 cash payment to [employer]. Accepted 05/19/22
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
## 2022-05-26 ENCOUNTER — Telehealth: Payer: Self-pay | Admitting: Orthopedic Surgery

## 2022-05-26 NOTE — Telephone Encounter (Signed)
Disability form from [facility] received. To [employer].

## 2022-06-02 ENCOUNTER — Ambulatory Visit (INDEPENDENT_AMBULATORY_CARE_PROVIDER_SITE_OTHER): Payer: BC Managed Care – PPO

## 2022-06-02 ENCOUNTER — Ambulatory Visit: Payer: BC Managed Care – PPO | Admitting: Surgical

## 2022-06-02 ENCOUNTER — Encounter: Payer: Self-pay | Admitting: Surgical

## 2022-06-02 DIAGNOSIS — S82892A Other fracture of left lower leg, initial encounter for closed fracture: Secondary | ICD-10-CM
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
## 2022-06-09 ENCOUNTER — Ambulatory Visit: Payer: BC Managed Care – PPO | Admitting: Surgical

## 2022-06-09 ENCOUNTER — Ambulatory Visit (INDEPENDENT_AMBULATORY_CARE_PROVIDER_SITE_OTHER): Payer: BC Managed Care – PPO

## 2022-06-09 ENCOUNTER — Ambulatory Visit: Payer: Self-pay

## 2022-06-09 ENCOUNTER — Encounter: Payer: Self-pay | Admitting: Surgical

## 2022-06-09 DIAGNOSIS — S82892A Other fracture of left lower leg, initial encounter for closed fracture: Secondary | ICD-10-CM | POA: Diagnosis not present
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
## 2022-06-28 ENCOUNTER — Ambulatory Visit: Payer: BC Managed Care – PPO | Admitting: Orthopedic Surgery

## 2022-06-28 ENCOUNTER — Ambulatory Visit (INDEPENDENT_AMBULATORY_CARE_PROVIDER_SITE_OTHER): Payer: BC Managed Care – PPO

## 2022-06-28 DIAGNOSIS — S82892A Other fracture of left lower leg, initial encounter for closed fracture: Secondary | ICD-10-CM

## 2022-06-29 ENCOUNTER — Encounter: Payer: Self-pay | Admitting: Orthopedic Surgery
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
## 2022-07-03 ENCOUNTER — Telehealth: Payer: Self-pay | Admitting: Orthopedic Surgery

## 2022-07-03 NOTE — Telephone Encounter (Signed)
[employer] STD form received. To [employer].

## 2022-07-12 ENCOUNTER — Telehealth: Payer: Self-pay | Admitting: Orthopedic Surgery

## 2022-07-19 ENCOUNTER — Ambulatory Visit: Payer: BC Managed Care – PPO | Admitting: Orthopedic Surgery

## 2022-07-19 ENCOUNTER — Ambulatory Visit (INDEPENDENT_AMBULATORY_CARE_PROVIDER_SITE_OTHER): Payer: BC Managed Care – PPO

## 2022-07-19 ENCOUNTER — Encounter: Payer: Self-pay | Admitting: Orthopedic Surgery

## 2022-07-19 DIAGNOSIS — S82892A Other fracture of left lower leg, initial encounter for closed fracture: Secondary | ICD-10-CM | POA: Diagnosis not present
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
## 2022-10-11 NOTE — Telephone Encounter (Signed)
error 

## 2022-12-06 ENCOUNTER — Encounter: Payer: Self-pay | Admitting: Hematology & Oncology

## 2023-02-27 ENCOUNTER — Ambulatory Visit (HOSPITAL_BASED_OUTPATIENT_CLINIC_OR_DEPARTMENT_OTHER): Payer: BC Managed Care – PPO | Attending: Family Medicine

## 2023-02-27 ENCOUNTER — Other Ambulatory Visit: Payer: Self-pay

## 2023-02-27 ENCOUNTER — Encounter: Payer: Self-pay | Admitting: Family Medicine

## 2023-02-27 ENCOUNTER — Telehealth: Payer: BC Managed Care – PPO | Admitting: Family Medicine

## 2023-02-27 DIAGNOSIS — R310 Gross hematuria: Secondary | ICD-10-CM | POA: Diagnosis not present

## 2023-02-27 DIAGNOSIS — R319 Hematuria, unspecified: Secondary | ICD-10-CM | POA: Diagnosis not present

## 2023-02-27 LAB — MICROSCOPIC EXAMINATION
Epithelial Cells (non renal): NONE SEEN /hpf (ref 0–10)
RBC, Urine: >30 /hpf — ABNORMAL HIGH (ref 0–2)
Renal Epithel, UA: NONE SEEN /hpf
WBC, UA: NONE SEEN /hpf (ref 0–5)

## 2023-02-27 LAB — URINALYSIS, COMPLETE
Bilirubin, UA: NEGATIVE
Glucose, UA: NEGATIVE
Ketones, UA: NEGATIVE
Leukocytes,UA: NEGATIVE
Nitrite, UA: NEGATIVE
Specific Gravity, UA: 1.025 (ref 1.005–1.030)
Urobilinogen, Ur: 0.2 mg/dL (ref 0.2–1.0)
pH, UA: 5.5 (ref 5.0–7.5)

## 2023-02-27 NOTE — Progress Notes (Signed)
MyChart Video visit  Subjective: [name] urine PCP: Brian Norlander, DO [name] Brian Kerr is a 47 y.o. male. Patient provides verbal consent for consult held via video.  Due to COVID-19 pandemic this visit was conducted virtually. This visit type was conducted due to national recommendations for restrictions regarding the COVID-19 Pandemic (e.g. social distancing, sheltering in place) in an effort to limit this patient's exposure and mitigate transmission in our community. All issues noted in this document were discussed and addressed.  A physical exam was not performed with this format.   Location of patient: car Location of provider: [facility] Others present for call: none  1. Bloody urine Patient reports that he has been having gross blood in urine since Sunday. No h/o kidney or bladder stones.  When he urinated this am, urine was clear and when he came to the office it was bloody again.  He peed a blood clot last night.  He reports dysuria this am.  No urgency, flank pain, nausea, vomiting or fevers.  Had a stomach bug about 2 weeks ago.  Hydrating well with water.   ROS: Per HPI  Allergies  Allergen Reactions   Morphine And Related Other (See Comments)    irritable   Past Medical History:  Diagnosis Date   Allergy    seasonal   Blood transfusion without reported diagnosis    Gallstones    Iron deficiency anemia due to chronic blood loss 11/14/2017   Obesity    Sleep apnea     Current Outpatient Medications:    amLODipine (NORVASC) 5 MG tablet, Take 1 tablet (5 mg total) by mouth daily., Disp: 90 tablet, Rfl: 3   fluticasone (FLONASE) 50 MCG/ACT nasal spray, SPRAY 2 SPRAYS INTO EACH NOSTRIL EVERY DAY, Disp: 48 mL, Rfl: 1   methocarbamol (ROBAXIN) 500 MG tablet, Take 0.5-1 tablets (250-500 mg total) by mouth every 8 (eight) hours as needed for muscle spasms., Disp: 20 tablet, Rfl: 0   oxyCODONE-acetaminophen (PERCOCET/ROXICET) 5-325 MG tablet, Take 1 tablet by mouth every 6  (six) hours as needed for severe pain., Disp: 15 tablet, Rfl: 0   rosuvastatin (CRESTOR) 10 MG tablet, Take 1 tablet (10 mg total) by mouth daily., Disp: 90 tablet, Rfl: 3   Semaglutide,0.25 or 0.'5MG'$ /DOS, (OZEMPIC, 0.25 OR 0.5 MG/DOSE,) 2 MG/1.5ML SOPN, Inject 0.5 mg into the skin once a week., Disp: 2 mL, Rfl: 12   triamterene-hydrochlorothiazide (MAXZIDE-25) 37.5-25 MG tablet, Take 1 tablet by mouth daily., Disp: 90 tablet, Rfl: 3  Gen: nontoxic appearing male, no appreciable pallor Pulm: normal WOB on room air Neuro: Aox3  Assessment/ Plan: 47 y.o. male   Gross hematuria - Plan: CT RENAL STONE STUDY  Patient with gross hematuria.  No nitrites or white blood cells on urine specimen to suggest infection but he did have a few bacteria so we will send this for culture.  I am going to evaluate for possible bladder or renal stone causing the symptoms as they seem to be waxing and waning.  If this is unremarkable, I plan to send him stat to urology for further evaluation.  May need cystoscopy.  Start time: 10:38am End time: 10:49a  Total time spent on patient care (including video visit/ documentation): 11 minutes  [place], [place] [phone]

## 2023-03-01 LAB — URINE CULTURE

## 2023-03-02 ENCOUNTER — Encounter: Payer: Self-pay | Admitting: Family Medicine

## 2023-03-14 ENCOUNTER — Telehealth: Payer: Self-pay | Admitting: Family Medicine

## 2023-03-14 NOTE — Telephone Encounter (Signed)
Patient calling back to check on appt

## 2023-03-14 NOTE — Telephone Encounter (Signed)
Lmtcb.

## 2023-03-14 NOTE — Telephone Encounter (Signed)
Pt r/c.

## 2023-03-15 NOTE — Telephone Encounter (Signed)
Attempted to call pt , we can do a dod day if pt is able to come
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
## 2024-02-14 ENCOUNTER — Encounter: Payer: Self-pay | Admitting: Family

## 2024-02-14 ENCOUNTER — Ambulatory Visit (INDEPENDENT_AMBULATORY_CARE_PROVIDER_SITE_OTHER): Payer: No Typology Code available for payment source | Admitting: Family

## 2024-02-14 ENCOUNTER — Encounter: Payer: Self-pay | Admitting: Hematology & Oncology

## 2024-02-14 VITALS — BP 143/94 | HR 85 | Ht 72.0 in | Wt 268.4 lb

## 2024-02-14 DIAGNOSIS — B029 Zoster without complications: Secondary | ICD-10-CM | POA: Diagnosis not present

## 2024-02-14 DIAGNOSIS — M62830 Muscle spasm of back: Secondary | ICD-10-CM | POA: Diagnosis not present

## 2024-02-14 MED ORDER — VALACYCLOVIR HCL 1 G PO TABS: 1000.0000 mg | ORAL_TABLET | Freq: Three times a day (TID) | ORAL | 0 refills | Status: DC

## 2024-02-14 MED ORDER — VALACYCLOVIR HCL 1 G PO TABS: 1000.0000 mg | ORAL_TABLET | Freq: Three times a day (TID) | ORAL | 0 refills | Status: AC

## 2024-02-14 NOTE — Progress Notes (Signed)
Subjective:    Patient ID: Brian Kerr, male    DOB: [birth year], 48 y.o.   MRN: [id number]  Chief Complaint  Patient presents with   Rash    Possible shingls   Pt presents to the office today with a rash that started a week ago on left neck. Reports burning and stinging pain.   Reports the rash started spreading up behind his ear and new cluster of blisters on the front left of his chest. Does not cross midline.  Rash This is a new problem. The current episode started in the past 7 days. The problem has been gradually worsening since onset. The affected locations include the neck. The rash is characterized by blistering, burning and draining. Past treatments include anti-itch cream and topical steroids. The treatment provided mild relief.      Review of Systems  Skin:  Positive for rash.  All other systems reviewed and are negative.   Social History   Socioeconomic History   Marital status: Divorced    Spouse name: Not on file   Number of children: 2   Years of education: Not on file   Highest education level: Not on file  Occupational History   Occupation: salesman- cutting tools  Tobacco Use   Smoking status: Former   Smokeless tobacco: Former    Types: Chew, Snuff  Vaping Use   Vaping status: Never Used  Substance and Sexual Activity   Alcohol use: Yes    Comment: occasional   Drug use: No   Sexual activity: Yes    Partners: Female  Other Topics Concern   Not on file  Social History Narrative   Not on file   Social Drivers of Health   Financial Resource Strain: Not on file  Food Insecurity: Not on file  Transportation Needs: Not on file  Physical Activity: Not on file  Stress: Not on file  Social Connections: Not on file   Family History  Problem Relation Age of Onset   Cancer Mother        liver   Diabetes Mother    Colon cancer Mother 43   Cancer Paternal Grandmother        lymph nodes   Cancer Paternal Grandfather        prostate         Objective:   Physical Exam Vitals reviewed.  Constitutional:      General: He is not in acute distress.    Appearance: He is well-developed.  HENT:     Head: Normocephalic.     Right Ear: Tympanic membrane and external ear normal.     Left Ear: Tympanic membrane and external ear normal.  Eyes:     General:        Right eye: No discharge.        Left eye: No discharge.     Pupils: Pupils are equal, round, and reactive to light.  Neck:     Thyroid: No thyromegaly.  Cardiovascular:     Rate and Rhythm: Normal rate and regular rhythm.     Heart sounds: Normal heart sounds. No murmur heard. Pulmonary:     Effort: Pulmonary effort is normal. No respiratory distress.     Breath sounds: Normal breath sounds. No wheezing.  Abdominal:     General: Bowel sounds are normal. There is no distension.     Palpations: Abdomen is soft.     Tenderness: There is no abdominal tenderness.  Musculoskeletal:  General: No tenderness. Normal range of motion.     Cervical back: Normal range of motion and neck supple.  Skin:    General: Skin is warm and dry.     Findings: Rash present. No erythema. Rash is vesicular.       Neurological:     Mental Status: He is alert and oriented to person, place, and time.     Cranial Nerves: No cranial nerve deficit.     Deep Tendon Reflexes: Reflexes are normal and symmetric.  Psychiatric:        Behavior: Behavior normal.        Thought Content: Thought content normal.        Judgment: Judgment normal.       BP (!) 143/94   Pulse 85   Ht 6' (1.829 m)   Wt 268 lb 6.4 oz (121.7 kg)   SpO2 93%   BMI 36.40 kg/m      Assessment & Plan:  Brian Kerr comes in today with chief complaint of Rash (Possible shingls)   Diagnosis and orders addressed:  1. Herpes zoster without complication (Primary) Start valtrex for 7-10 days Cool compresses Avoid scratching  Avoid pregnant women, elderly and immune compromise  Follow up with PCP -  valACYclovir (VALTREX) 1000 MG tablet; Take 1 tablet (1,000 mg total) by mouth 3 (three) times daily for 10 days.  Dispense: 30 tablet; Refill: 0    Jannifer Rodney, FNP

## 2024-02-14 NOTE — Patient Instructions (Signed)
 Shingles  Shingles, or herpes zoster, is an infection. It gives you a skin rash and blisters. These infected areas may hurt a lot. Shingles only happens if: You've had chickenpox. You've been given a shot called a vaccine to protect you from getting chickenpox. Shingles is rare in this case. What are the causes? Shingles is caused by a germ called the varicella-zoster virus. This is the same germ that causes chickenpox. After you're exposed to the germ, it stays in your body but is dormant. This means it isn't active. Shingles happens if the germ becomes active again. This can happen years after you're first exposed to the germ. What increases the risk? You may be more likely to get shingles if: You're older than 49 years of age. You're under a lot of stress. You have a weak immune system. The immune system is your body's defense system. It may be weak if: You have human immunodeficiency virus (HIV). You have acquired immunodeficiency syndrome (AIDS). You have cancer. You take medicines that weaken your immune system. These include organ transplant medicines. What are the signs or symptoms? The first symptoms of shingles may be itching, tingling, or pain. Your skin may feel like it's burning. A few days or weeks later, you'll get a rash. Here's what you can expect: The rash is likely to be on one side of your body. The rash may be shaped like a belt or a band. Over time, it will turn into blisters filled with fluid. The blisters will break open and change into scabs. The scabs will dry up in about 2-3 weeks. You may also have: A fever. Chills. A headache. Nausea. How is this diagnosed? Shingles is diagnosed with a skin exam. A sample called a culture may be taken from one of your blisters and sent to a lab. This will show if you have shingles. How is this treated? The rash may last for several weeks. There's no cure for shingles, but your health care provider may give you medicines.  These medicines may: Help with pain. Help with itching. Help with irritation and swelling. Help you get better sooner. Help to prevent long-term problems. If the rash is on your face, you may need to see an eye doctor or an ear, nose, and throat (ENT) doctor. Follow these instructions at home: Medicines Take your medicines only as told by your provider. Put an anti-itch cream or numbing cream on the rash or blisters as told by your provider. Relieving itching and discomfort  To help with itching: Put cold, wet cloths called cold compresses on the rash or blisters. Take a cool bath. Try adding baking soda or dry oatmeal to the water. Do not bathe in hot water. Use calamine lotion on the rash or blisters. You can get this type of lotion at the store. Blister and rash care Keep your rash covered with a loose bandage. Wear loose clothes that don't rub on your rash. Take care of your rash as told by your provider. Make sure you: Wash your hands with soap and water for at least 20 seconds before and after you change your bandage. If you can't use soap and water, use hand sanitizer. Keep your rash and blisters clean by washing them with mild soap and cool water. Change your bandage. Check your rash every day for signs of infection. Check for: More redness, swelling, or pain. Fluid or blood. Warmth. Pus or a bad smell. Do not scratch your rash. Do not pick at your  blisters. To help you not scratch: Keep your fingernails clean and cut short. Try to wear gloves or mittens when you sleep. General instructions Rest. Wash your hands often with soap and water for at least 20 seconds. If you can't use soap and water, use hand sanitizer. Washing your hands lowers your chance of getting a skin infection. Your infection can cause chickenpox in others. If you have blisters that aren't scabs yet, stay away from: Babies. Pregnant people. Children who have eczema. Older people who have organ  transplants. People who have a long-term, or chronic, illness. Anyone who hasn't had chickenpox before. Anyone who hasn't gotten the chickenpox vaccine. How is this prevented? Vaccines are the best way to prevent you from getting chickenpox or shingles. Talk with your provider about getting these shots. Where to find more information Centers for Disease Control and Prevention (CDC): [employer].no Contact a health care provider if: Your pain doesn't get better with medicine. Your pain doesn't get better after the rash heals. You have any signs of infection around the rash. Your rash or blisters get worse. You have a fever or chills. Get help right away if: The rash is on your face or nose. You have pain in your face or by your eye. You lose feeling on one side of your face. You have trouble seeing. You have ear pain or ringing in your ear. This information is not intended to replace advice given to you by your health care provider. Make sure you discuss any questions you have with your health care provider. Document Revised: 09/13/2023 Document Reviewed: 01/26/2023 Elsevier Patient Education  2024 [employer].

## 2024-02-14 NOTE — Addendum Note (Signed)
Addended by: Julious Payer D on: 02/14/2024 03:55 PM   Modules accepted: Orders
